# Patient Record
Sex: Female | Born: 1950
Health system: Southern US, Community
[De-identification: ages and names within clinical notes are randomized; demographics above are authoritative.]

## PROBLEM LIST (undated history)

## (undated) DIAGNOSIS — K589 Irritable bowel syndrome without diarrhea: Secondary | ICD-10-CM

## (undated) DIAGNOSIS — K219 Gastro-esophageal reflux disease without esophagitis: Secondary | ICD-10-CM

## (undated) DIAGNOSIS — F32A Depression, unspecified: Secondary | ICD-10-CM

## (undated) DIAGNOSIS — F419 Anxiety disorder, unspecified: Secondary | ICD-10-CM

## (undated) DIAGNOSIS — E785 Hyperlipidemia, unspecified: Secondary | ICD-10-CM

## (undated) DIAGNOSIS — M1990 Unspecified osteoarthritis, unspecified site: Secondary | ICD-10-CM

## (undated) DIAGNOSIS — I10 Essential (primary) hypertension: Secondary | ICD-10-CM

## (undated) DIAGNOSIS — K8020 Calculus of gallbladder without cholecystitis without obstruction: Secondary | ICD-10-CM

## (undated) DIAGNOSIS — E119 Type 2 diabetes mellitus without complications: Secondary | ICD-10-CM

## (undated) DIAGNOSIS — M797 Fibromyalgia: Secondary | ICD-10-CM

## (undated) HISTORY — DX: Fibromyalgia: M79.7

## (undated) HISTORY — PX: NASAL SEPTUM SURGERY: SHX37

## (undated) HISTORY — DX: Anxiety disorder, unspecified: F41.9

## (undated) HISTORY — DX: Hyperlipidemia, unspecified: E78.5

## (undated) HISTORY — DX: Unspecified osteoarthritis, unspecified site: M19.90

## (undated) HISTORY — DX: Essential (primary) hypertension: I10

## (undated) HISTORY — DX: Calculus of gallbladder without cholecystitis without obstruction: K80.20

## (undated) HISTORY — PX: ROTATOR CUFF REPAIR: SHX139

## (undated) HISTORY — PX: KNEE CARTILAGE SURGERY: SHX688

## (undated) HISTORY — PX: COLONOSCOPY: SHX174

## (undated) HISTORY — PX: CHOLECYSTECTOMY: SHX55

## (undated) HISTORY — DX: Irritable bowel syndrome, unspecified: K58.9

## (undated) HISTORY — DX: Gastro-esophageal reflux disease without esophagitis: K21.9

## (undated) HISTORY — DX: Depression, unspecified: F32.A

## (undated) HISTORY — DX: Type 2 diabetes mellitus without complications: E11.9

---

## 2015-02-26 DIAGNOSIS — Z23 Encounter for immunization: Secondary | ICD-10-CM | POA: Diagnosis not present

## 2015-03-24 DIAGNOSIS — M2140 Flat foot [pes planus] (acquired), unspecified foot: Secondary | ICD-10-CM | POA: Diagnosis not present

## 2015-04-14 DIAGNOSIS — R3915 Urgency of urination: Secondary | ICD-10-CM | POA: Diagnosis not present

## 2015-04-16 DIAGNOSIS — R3915 Urgency of urination: Secondary | ICD-10-CM | POA: Diagnosis not present

## 2015-05-04 DIAGNOSIS — M2140 Flat foot [pes planus] (acquired), unspecified foot: Secondary | ICD-10-CM | POA: Diagnosis not present

## 2015-05-04 DIAGNOSIS — M797 Fibromyalgia: Secondary | ICD-10-CM | POA: Diagnosis not present

## 2015-06-16 DIAGNOSIS — J309 Allergic rhinitis, unspecified: Secondary | ICD-10-CM | POA: Diagnosis not present

## 2015-06-16 DIAGNOSIS — M797 Fibromyalgia: Secondary | ICD-10-CM | POA: Diagnosis not present

## 2015-06-16 DIAGNOSIS — F5105 Insomnia due to other mental disorder: Secondary | ICD-10-CM | POA: Diagnosis not present

## 2015-06-16 DIAGNOSIS — I10 Essential (primary) hypertension: Secondary | ICD-10-CM | POA: Diagnosis not present

## 2015-06-16 DIAGNOSIS — Z0000 Encounter for general adult medical examination without abnormal findings: Secondary | ICD-10-CM | POA: Diagnosis not present

## 2015-06-16 DIAGNOSIS — E119 Type 2 diabetes mellitus without complications: Secondary | ICD-10-CM | POA: Diagnosis not present

## 2015-07-30 ENCOUNTER — Ambulatory Visit (INDEPENDENT_AMBULATORY_CARE_PROVIDER_SITE_OTHER): Payer: Medicare Other | Admitting: Otolaryngology

## 2015-07-30 DIAGNOSIS — R070 Pain in throat: Secondary | ICD-10-CM | POA: Diagnosis not present

## 2015-11-19 DIAGNOSIS — Z23 Encounter for immunization: Secondary | ICD-10-CM | POA: Diagnosis not present

## 2016-01-11 DIAGNOSIS — R0989 Other specified symptoms and signs involving the circulatory and respiratory systems: Secondary | ICD-10-CM | POA: Diagnosis not present

## 2016-01-11 DIAGNOSIS — I10 Essential (primary) hypertension: Secondary | ICD-10-CM | POA: Diagnosis not present

## 2016-01-11 DIAGNOSIS — E119 Type 2 diabetes mellitus without complications: Secondary | ICD-10-CM | POA: Diagnosis not present

## 2016-05-31 DIAGNOSIS — R3915 Urgency of urination: Secondary | ICD-10-CM | POA: Diagnosis not present

## 2016-07-18 DIAGNOSIS — E119 Type 2 diabetes mellitus without complications: Secondary | ICD-10-CM | POA: Diagnosis not present

## 2016-07-18 DIAGNOSIS — I10 Essential (primary) hypertension: Secondary | ICD-10-CM | POA: Diagnosis not present

## 2016-07-18 DIAGNOSIS — J309 Allergic rhinitis, unspecified: Secondary | ICD-10-CM | POA: Diagnosis not present

## 2016-07-18 DIAGNOSIS — M797 Fibromyalgia: Secondary | ICD-10-CM | POA: Diagnosis not present

## 2016-07-18 DIAGNOSIS — Z0000 Encounter for general adult medical examination without abnormal findings: Secondary | ICD-10-CM | POA: Diagnosis not present

## 2016-07-18 DIAGNOSIS — F5105 Insomnia due to other mental disorder: Secondary | ICD-10-CM | POA: Diagnosis not present

## 2016-07-18 DIAGNOSIS — Z6841 Body Mass Index (BMI) 40.0 and over, adult: Secondary | ICD-10-CM | POA: Diagnosis not present

## 2016-08-02 DIAGNOSIS — Z1231 Encounter for screening mammogram for malignant neoplasm of breast: Secondary | ICD-10-CM | POA: Diagnosis not present

## 2016-10-26 DIAGNOSIS — Z23 Encounter for immunization: Secondary | ICD-10-CM | POA: Diagnosis not present

## 2017-02-16 DIAGNOSIS — I10 Essential (primary) hypertension: Secondary | ICD-10-CM | POA: Diagnosis not present

## 2017-02-16 DIAGNOSIS — E119 Type 2 diabetes mellitus without complications: Secondary | ICD-10-CM | POA: Diagnosis not present

## 2017-02-16 DIAGNOSIS — F5101 Primary insomnia: Secondary | ICD-10-CM | POA: Diagnosis not present

## 2017-02-16 DIAGNOSIS — Z6836 Body mass index (BMI) 36.0-36.9, adult: Secondary | ICD-10-CM | POA: Diagnosis not present

## 2017-02-16 DIAGNOSIS — M797 Fibromyalgia: Secondary | ICD-10-CM | POA: Diagnosis not present

## 2017-03-27 DIAGNOSIS — E782 Mixed hyperlipidemia: Secondary | ICD-10-CM | POA: Diagnosis not present

## 2017-03-27 DIAGNOSIS — E119 Type 2 diabetes mellitus without complications: Secondary | ICD-10-CM | POA: Diagnosis not present

## 2017-03-27 DIAGNOSIS — I10 Essential (primary) hypertension: Secondary | ICD-10-CM | POA: Diagnosis not present

## 2017-03-30 DIAGNOSIS — E119 Type 2 diabetes mellitus without complications: Secondary | ICD-10-CM | POA: Diagnosis not present

## 2017-03-30 DIAGNOSIS — J029 Acute pharyngitis, unspecified: Secondary | ICD-10-CM | POA: Diagnosis not present

## 2017-03-30 DIAGNOSIS — Z6837 Body mass index (BMI) 37.0-37.9, adult: Secondary | ICD-10-CM | POA: Diagnosis not present

## 2017-10-23 DIAGNOSIS — Z1231 Encounter for screening mammogram for malignant neoplasm of breast: Secondary | ICD-10-CM | POA: Diagnosis not present

## 2017-10-24 DIAGNOSIS — Z23 Encounter for immunization: Secondary | ICD-10-CM | POA: Diagnosis not present

## 2017-12-13 DIAGNOSIS — Z6837 Body mass index (BMI) 37.0-37.9, adult: Secondary | ICD-10-CM | POA: Diagnosis not present

## 2017-12-13 DIAGNOSIS — J029 Acute pharyngitis, unspecified: Secondary | ICD-10-CM | POA: Diagnosis not present

## 2017-12-13 DIAGNOSIS — E119 Type 2 diabetes mellitus without complications: Secondary | ICD-10-CM | POA: Diagnosis not present

## 2017-12-18 DIAGNOSIS — K210 Gastro-esophageal reflux disease with esophagitis: Secondary | ICD-10-CM | POA: Diagnosis not present

## 2017-12-18 DIAGNOSIS — E1165 Type 2 diabetes mellitus with hyperglycemia: Secondary | ICD-10-CM | POA: Diagnosis not present

## 2017-12-18 DIAGNOSIS — E782 Mixed hyperlipidemia: Secondary | ICD-10-CM | POA: Diagnosis not present

## 2017-12-18 DIAGNOSIS — F331 Major depressive disorder, recurrent, moderate: Secondary | ICD-10-CM | POA: Diagnosis not present

## 2017-12-18 DIAGNOSIS — I10 Essential (primary) hypertension: Secondary | ICD-10-CM | POA: Diagnosis not present

## 2017-12-26 ENCOUNTER — Other Ambulatory Visit (HOSPITAL_COMMUNITY): Payer: Self-pay | Admitting: Internal Medicine

## 2017-12-26 DIAGNOSIS — Z780 Asymptomatic menopausal state: Secondary | ICD-10-CM

## 2018-01-05 DIAGNOSIS — Z1211 Encounter for screening for malignant neoplasm of colon: Secondary | ICD-10-CM | POA: Diagnosis not present

## 2018-01-05 DIAGNOSIS — Z1212 Encounter for screening for malignant neoplasm of rectum: Secondary | ICD-10-CM | POA: Diagnosis not present

## 2018-01-11 ENCOUNTER — Ambulatory Visit (HOSPITAL_COMMUNITY)
Admission: RE | Admit: 2018-01-11 | Discharge: 2018-01-11 | Disposition: A | Discharge status: Home / Self Care | Payer: Medicare Other | Source: Ambulatory Visit | Attending: Internal Medicine | Admitting: Internal Medicine

## 2018-01-11 DIAGNOSIS — Z780 Asymptomatic menopausal state: Secondary | ICD-10-CM | POA: Insufficient documentation

## 2018-01-11 DIAGNOSIS — M810 Age-related osteoporosis without current pathological fracture: Secondary | ICD-10-CM | POA: Diagnosis not present

## 2018-01-11 DIAGNOSIS — M85832 Other specified disorders of bone density and structure, left forearm: Secondary | ICD-10-CM | POA: Diagnosis not present

## 2018-03-20 DIAGNOSIS — F331 Major depressive disorder, recurrent, moderate: Secondary | ICD-10-CM | POA: Diagnosis not present

## 2018-03-20 DIAGNOSIS — E1165 Type 2 diabetes mellitus with hyperglycemia: Secondary | ICD-10-CM | POA: Diagnosis not present

## 2018-03-20 DIAGNOSIS — R079 Chest pain, unspecified: Secondary | ICD-10-CM | POA: Diagnosis not present

## 2018-04-30 DIAGNOSIS — F411 Generalized anxiety disorder: Secondary | ICD-10-CM | POA: Diagnosis not present

## 2018-06-18 DIAGNOSIS — F419 Anxiety disorder, unspecified: Secondary | ICD-10-CM | POA: Diagnosis not present

## 2018-06-18 DIAGNOSIS — F331 Major depressive disorder, recurrent, moderate: Secondary | ICD-10-CM | POA: Diagnosis not present

## 2018-09-19 DIAGNOSIS — M25562 Pain in left knee: Secondary | ICD-10-CM | POA: Diagnosis not present

## 2018-09-19 DIAGNOSIS — F331 Major depressive disorder, recurrent, moderate: Secondary | ICD-10-CM | POA: Diagnosis not present

## 2018-09-19 DIAGNOSIS — F419 Anxiety disorder, unspecified: Secondary | ICD-10-CM | POA: Diagnosis not present

## 2018-09-19 DIAGNOSIS — M25552 Pain in left hip: Secondary | ICD-10-CM | POA: Diagnosis not present

## 2018-09-27 DIAGNOSIS — M48061 Spinal stenosis, lumbar region without neurogenic claudication: Secondary | ICD-10-CM | POA: Insufficient documentation

## 2018-09-27 DIAGNOSIS — M7062 Trochanteric bursitis, left hip: Secondary | ICD-10-CM | POA: Diagnosis not present

## 2018-09-27 DIAGNOSIS — M7060 Trochanteric bursitis, unspecified hip: Secondary | ICD-10-CM | POA: Insufficient documentation

## 2018-09-27 DIAGNOSIS — M5410 Radiculopathy, site unspecified: Secondary | ICD-10-CM | POA: Insufficient documentation

## 2018-09-27 DIAGNOSIS — M7061 Trochanteric bursitis, right hip: Secondary | ICD-10-CM | POA: Diagnosis not present

## 2018-10-16 DIAGNOSIS — E11319 Type 2 diabetes mellitus with unspecified diabetic retinopathy without macular edema: Secondary | ICD-10-CM | POA: Diagnosis not present

## 2018-10-29 DIAGNOSIS — Z23 Encounter for immunization: Secondary | ICD-10-CM | POA: Diagnosis not present

## 2018-11-28 DIAGNOSIS — M7060 Trochanteric bursitis, unspecified hip: Secondary | ICD-10-CM | POA: Diagnosis not present

## 2018-12-04 DIAGNOSIS — R350 Frequency of micturition: Secondary | ICD-10-CM | POA: Diagnosis not present

## 2018-12-05 DIAGNOSIS — R3915 Urgency of urination: Secondary | ICD-10-CM | POA: Diagnosis not present

## 2018-12-05 DIAGNOSIS — R319 Hematuria, unspecified: Secondary | ICD-10-CM | POA: Diagnosis not present

## 2018-12-05 DIAGNOSIS — N390 Urinary tract infection, site not specified: Secondary | ICD-10-CM | POA: Diagnosis not present

## 2018-12-13 DIAGNOSIS — H2511 Age-related nuclear cataract, right eye: Secondary | ICD-10-CM | POA: Diagnosis not present

## 2018-12-13 DIAGNOSIS — H35033 Hypertensive retinopathy, bilateral: Secondary | ICD-10-CM | POA: Diagnosis not present

## 2018-12-13 DIAGNOSIS — H25013 Cortical age-related cataract, bilateral: Secondary | ICD-10-CM | POA: Diagnosis not present

## 2018-12-13 DIAGNOSIS — H2513 Age-related nuclear cataract, bilateral: Secondary | ICD-10-CM | POA: Diagnosis not present

## 2018-12-13 DIAGNOSIS — H35363 Drusen (degenerative) of macula, bilateral: Secondary | ICD-10-CM | POA: Diagnosis not present

## 2018-12-27 DIAGNOSIS — Z1329 Encounter for screening for other suspected endocrine disorder: Secondary | ICD-10-CM | POA: Diagnosis not present

## 2018-12-27 DIAGNOSIS — I10 Essential (primary) hypertension: Secondary | ICD-10-CM | POA: Diagnosis not present

## 2018-12-27 DIAGNOSIS — M797 Fibromyalgia: Secondary | ICD-10-CM | POA: Diagnosis not present

## 2018-12-27 DIAGNOSIS — E119 Type 2 diabetes mellitus without complications: Secondary | ICD-10-CM | POA: Diagnosis not present

## 2018-12-31 DIAGNOSIS — M25562 Pain in left knee: Secondary | ICD-10-CM | POA: Diagnosis not present

## 2018-12-31 DIAGNOSIS — F419 Anxiety disorder, unspecified: Secondary | ICD-10-CM | POA: Diagnosis not present

## 2018-12-31 DIAGNOSIS — E1165 Type 2 diabetes mellitus with hyperglycemia: Secondary | ICD-10-CM | POA: Diagnosis not present

## 2018-12-31 DIAGNOSIS — K219 Gastro-esophageal reflux disease without esophagitis: Secondary | ICD-10-CM | POA: Diagnosis not present

## 2018-12-31 DIAGNOSIS — I10 Essential (primary) hypertension: Secondary | ICD-10-CM | POA: Diagnosis not present

## 2018-12-31 DIAGNOSIS — M545 Low back pain: Secondary | ICD-10-CM | POA: Diagnosis not present

## 2018-12-31 DIAGNOSIS — F331 Major depressive disorder, recurrent, moderate: Secondary | ICD-10-CM | POA: Diagnosis not present

## 2018-12-31 DIAGNOSIS — E782 Mixed hyperlipidemia: Secondary | ICD-10-CM | POA: Diagnosis not present

## 2019-01-08 DIAGNOSIS — H25811 Combined forms of age-related cataract, right eye: Secondary | ICD-10-CM | POA: Diagnosis not present

## 2019-02-05 DIAGNOSIS — H2513 Age-related nuclear cataract, bilateral: Secondary | ICD-10-CM | POA: Diagnosis not present

## 2019-02-05 DIAGNOSIS — H25812 Combined forms of age-related cataract, left eye: Secondary | ICD-10-CM | POA: Diagnosis not present

## 2019-02-15 DIAGNOSIS — H2513 Age-related nuclear cataract, bilateral: Secondary | ICD-10-CM | POA: Diagnosis not present

## 2019-05-28 DIAGNOSIS — M7062 Trochanteric bursitis, left hip: Secondary | ICD-10-CM | POA: Diagnosis not present

## 2019-05-28 DIAGNOSIS — M7061 Trochanteric bursitis, right hip: Secondary | ICD-10-CM | POA: Diagnosis not present

## 2019-05-29 DIAGNOSIS — E119 Type 2 diabetes mellitus without complications: Secondary | ICD-10-CM | POA: Diagnosis not present

## 2019-05-29 DIAGNOSIS — I10 Essential (primary) hypertension: Secondary | ICD-10-CM | POA: Diagnosis not present

## 2019-05-29 DIAGNOSIS — F418 Other specified anxiety disorders: Secondary | ICD-10-CM | POA: Diagnosis not present

## 2019-05-29 DIAGNOSIS — F419 Anxiety disorder, unspecified: Secondary | ICD-10-CM | POA: Diagnosis not present

## 2019-05-29 DIAGNOSIS — M797 Fibromyalgia: Secondary | ICD-10-CM | POA: Diagnosis not present

## 2019-05-29 DIAGNOSIS — Z79899 Other long term (current) drug therapy: Secondary | ICD-10-CM | POA: Diagnosis not present

## 2019-06-04 DIAGNOSIS — R110 Nausea: Secondary | ICD-10-CM | POA: Diagnosis not present

## 2019-06-24 DIAGNOSIS — M797 Fibromyalgia: Secondary | ICD-10-CM | POA: Diagnosis not present

## 2019-06-24 DIAGNOSIS — I10 Essential (primary) hypertension: Secondary | ICD-10-CM | POA: Diagnosis not present

## 2019-06-24 DIAGNOSIS — F411 Generalized anxiety disorder: Secondary | ICD-10-CM | POA: Diagnosis not present

## 2019-06-24 DIAGNOSIS — F419 Anxiety disorder, unspecified: Secondary | ICD-10-CM | POA: Diagnosis not present

## 2019-06-24 DIAGNOSIS — E119 Type 2 diabetes mellitus without complications: Secondary | ICD-10-CM | POA: Diagnosis not present

## 2019-06-24 DIAGNOSIS — F5101 Primary insomnia: Secondary | ICD-10-CM | POA: Diagnosis not present

## 2019-06-24 DIAGNOSIS — R350 Frequency of micturition: Secondary | ICD-10-CM | POA: Diagnosis not present

## 2019-06-24 DIAGNOSIS — F331 Major depressive disorder, recurrent, moderate: Secondary | ICD-10-CM | POA: Diagnosis not present

## 2019-06-24 DIAGNOSIS — E039 Hypothyroidism, unspecified: Secondary | ICD-10-CM | POA: Diagnosis not present

## 2019-06-24 DIAGNOSIS — Z6836 Body mass index (BMI) 36.0-36.9, adult: Secondary | ICD-10-CM | POA: Diagnosis not present

## 2019-07-01 DIAGNOSIS — M25562 Pain in left knee: Secondary | ICD-10-CM | POA: Diagnosis not present

## 2019-07-01 DIAGNOSIS — E1165 Type 2 diabetes mellitus with hyperglycemia: Secondary | ICD-10-CM | POA: Diagnosis not present

## 2019-07-01 DIAGNOSIS — I10 Essential (primary) hypertension: Secondary | ICD-10-CM | POA: Diagnosis not present

## 2019-07-01 DIAGNOSIS — F419 Anxiety disorder, unspecified: Secondary | ICD-10-CM | POA: Diagnosis not present

## 2019-07-01 DIAGNOSIS — M545 Low back pain: Secondary | ICD-10-CM | POA: Diagnosis not present

## 2019-07-01 DIAGNOSIS — D751 Secondary polycythemia: Secondary | ICD-10-CM | POA: Diagnosis not present

## 2019-07-01 DIAGNOSIS — F331 Major depressive disorder, recurrent, moderate: Secondary | ICD-10-CM | POA: Diagnosis not present

## 2019-07-01 DIAGNOSIS — K219 Gastro-esophageal reflux disease without esophagitis: Secondary | ICD-10-CM | POA: Diagnosis not present

## 2019-07-01 DIAGNOSIS — E782 Mixed hyperlipidemia: Secondary | ICD-10-CM | POA: Diagnosis not present

## 2019-07-02 DIAGNOSIS — Z299 Encounter for prophylactic measures, unspecified: Secondary | ICD-10-CM | POA: Diagnosis not present

## 2019-07-02 DIAGNOSIS — Z789 Other specified health status: Secondary | ICD-10-CM | POA: Diagnosis not present

## 2019-07-02 DIAGNOSIS — F419 Anxiety disorder, unspecified: Secondary | ICD-10-CM | POA: Diagnosis not present

## 2019-07-02 DIAGNOSIS — E878 Other disorders of electrolyte and fluid balance, not elsewhere classified: Secondary | ICD-10-CM | POA: Diagnosis not present

## 2019-07-02 DIAGNOSIS — E119 Type 2 diabetes mellitus without complications: Secondary | ICD-10-CM | POA: Diagnosis not present

## 2019-07-02 DIAGNOSIS — I10 Essential (primary) hypertension: Secondary | ICD-10-CM | POA: Diagnosis not present

## 2019-08-26 DIAGNOSIS — H35033 Hypertensive retinopathy, bilateral: Secondary | ICD-10-CM | POA: Diagnosis not present

## 2019-10-02 DIAGNOSIS — M7060 Trochanteric bursitis, unspecified hip: Secondary | ICD-10-CM | POA: Diagnosis not present

## 2019-10-02 DIAGNOSIS — R29898 Other symptoms and signs involving the musculoskeletal system: Secondary | ICD-10-CM | POA: Insufficient documentation

## 2019-10-10 DIAGNOSIS — Z23 Encounter for immunization: Secondary | ICD-10-CM | POA: Diagnosis not present

## 2019-10-30 DIAGNOSIS — N6489 Other specified disorders of breast: Secondary | ICD-10-CM | POA: Diagnosis not present

## 2019-10-30 DIAGNOSIS — N6312 Unspecified lump in the right breast, upper inner quadrant: Secondary | ICD-10-CM | POA: Diagnosis not present

## 2019-10-30 DIAGNOSIS — R928 Other abnormal and inconclusive findings on diagnostic imaging of breast: Secondary | ICD-10-CM | POA: Diagnosis not present

## 2019-10-30 DIAGNOSIS — N644 Mastodynia: Secondary | ICD-10-CM | POA: Diagnosis not present

## 2019-10-31 DIAGNOSIS — Z23 Encounter for immunization: Secondary | ICD-10-CM | POA: Diagnosis not present

## 2019-11-13 DIAGNOSIS — Z23 Encounter for immunization: Secondary | ICD-10-CM | POA: Diagnosis not present

## 2019-12-23 DIAGNOSIS — F419 Anxiety disorder, unspecified: Secondary | ICD-10-CM | POA: Diagnosis not present

## 2019-12-23 DIAGNOSIS — M7060 Trochanteric bursitis, unspecified hip: Secondary | ICD-10-CM | POA: Diagnosis not present

## 2019-12-23 DIAGNOSIS — I10 Essential (primary) hypertension: Secondary | ICD-10-CM | POA: Diagnosis not present

## 2019-12-23 DIAGNOSIS — Z299 Encounter for prophylactic measures, unspecified: Secondary | ICD-10-CM | POA: Diagnosis not present

## 2019-12-23 DIAGNOSIS — E119 Type 2 diabetes mellitus without complications: Secondary | ICD-10-CM | POA: Diagnosis not present

## 2019-12-23 DIAGNOSIS — Z6833 Body mass index (BMI) 33.0-33.9, adult: Secondary | ICD-10-CM | POA: Diagnosis not present

## 2020-01-06 DIAGNOSIS — M7062 Trochanteric bursitis, left hip: Secondary | ICD-10-CM | POA: Diagnosis not present

## 2020-01-06 DIAGNOSIS — M7061 Trochanteric bursitis, right hip: Secondary | ICD-10-CM | POA: Diagnosis not present

## 2020-01-06 DIAGNOSIS — M545 Low back pain, unspecified: Secondary | ICD-10-CM | POA: Diagnosis not present

## 2020-01-06 DIAGNOSIS — M419 Scoliosis, unspecified: Secondary | ICD-10-CM | POA: Diagnosis not present

## 2020-02-11 DIAGNOSIS — I10 Essential (primary) hypertension: Secondary | ICD-10-CM | POA: Diagnosis not present

## 2020-02-11 DIAGNOSIS — Z6832 Body mass index (BMI) 32.0-32.9, adult: Secondary | ICD-10-CM | POA: Diagnosis not present

## 2020-02-11 DIAGNOSIS — Z789 Other specified health status: Secondary | ICD-10-CM | POA: Diagnosis not present

## 2020-02-11 DIAGNOSIS — E1165 Type 2 diabetes mellitus with hyperglycemia: Secondary | ICD-10-CM | POA: Diagnosis not present

## 2020-02-11 DIAGNOSIS — M7060 Trochanteric bursitis, unspecified hip: Secondary | ICD-10-CM | POA: Diagnosis not present

## 2020-02-11 DIAGNOSIS — Z299 Encounter for prophylactic measures, unspecified: Secondary | ICD-10-CM | POA: Diagnosis not present

## 2020-02-18 DIAGNOSIS — R110 Nausea: Secondary | ICD-10-CM | POA: Diagnosis not present

## 2020-02-18 DIAGNOSIS — M7060 Trochanteric bursitis, unspecified hip: Secondary | ICD-10-CM | POA: Diagnosis not present

## 2020-02-18 DIAGNOSIS — I10 Essential (primary) hypertension: Secondary | ICD-10-CM | POA: Diagnosis not present

## 2020-02-18 DIAGNOSIS — Z299 Encounter for prophylactic measures, unspecified: Secondary | ICD-10-CM | POA: Diagnosis not present

## 2020-02-18 DIAGNOSIS — E119 Type 2 diabetes mellitus without complications: Secondary | ICD-10-CM | POA: Diagnosis not present

## 2020-03-04 DIAGNOSIS — L659 Nonscarring hair loss, unspecified: Secondary | ICD-10-CM | POA: Diagnosis not present

## 2020-03-09 DIAGNOSIS — I10 Essential (primary) hypertension: Secondary | ICD-10-CM | POA: Diagnosis not present

## 2020-04-06 DIAGNOSIS — I10 Essential (primary) hypertension: Secondary | ICD-10-CM | POA: Diagnosis not present

## 2020-05-07 DIAGNOSIS — I10 Essential (primary) hypertension: Secondary | ICD-10-CM | POA: Diagnosis not present

## 2020-06-05 DIAGNOSIS — I10 Essential (primary) hypertension: Secondary | ICD-10-CM | POA: Diagnosis not present

## 2020-07-07 DIAGNOSIS — I10 Essential (primary) hypertension: Secondary | ICD-10-CM | POA: Diagnosis not present

## 2020-08-03 DIAGNOSIS — R29898 Other symptoms and signs involving the musculoskeletal system: Secondary | ICD-10-CM | POA: Diagnosis not present

## 2020-08-03 DIAGNOSIS — M7061 Trochanteric bursitis, right hip: Secondary | ICD-10-CM | POA: Diagnosis not present

## 2020-08-03 DIAGNOSIS — M48061 Spinal stenosis, lumbar region without neurogenic claudication: Secondary | ICD-10-CM | POA: Diagnosis not present

## 2020-08-03 DIAGNOSIS — M7062 Trochanteric bursitis, left hip: Secondary | ICD-10-CM | POA: Diagnosis not present

## 2020-08-06 DIAGNOSIS — I10 Essential (primary) hypertension: Secondary | ICD-10-CM | POA: Diagnosis not present

## 2020-09-04 DIAGNOSIS — I10 Essential (primary) hypertension: Secondary | ICD-10-CM | POA: Diagnosis not present

## 2020-09-07 DIAGNOSIS — M48061 Spinal stenosis, lumbar region without neurogenic claudication: Secondary | ICD-10-CM | POA: Diagnosis not present

## 2020-09-07 DIAGNOSIS — M7061 Trochanteric bursitis, right hip: Secondary | ICD-10-CM | POA: Diagnosis not present

## 2020-09-07 DIAGNOSIS — M7062 Trochanteric bursitis, left hip: Secondary | ICD-10-CM | POA: Diagnosis not present

## 2020-09-07 DIAGNOSIS — R29898 Other symptoms and signs involving the musculoskeletal system: Secondary | ICD-10-CM | POA: Diagnosis not present

## 2020-09-28 DIAGNOSIS — M4186 Other forms of scoliosis, lumbar region: Secondary | ICD-10-CM | POA: Diagnosis not present

## 2020-09-28 DIAGNOSIS — Z9049 Acquired absence of other specified parts of digestive tract: Secondary | ICD-10-CM | POA: Diagnosis not present

## 2020-09-28 DIAGNOSIS — M47816 Spondylosis without myelopathy or radiculopathy, lumbar region: Secondary | ICD-10-CM | POA: Diagnosis not present

## 2020-09-28 DIAGNOSIS — R634 Abnormal weight loss: Secondary | ICD-10-CM | POA: Diagnosis not present

## 2020-09-30 DIAGNOSIS — R110 Nausea: Secondary | ICD-10-CM | POA: Diagnosis not present

## 2020-09-30 DIAGNOSIS — Z299 Encounter for prophylactic measures, unspecified: Secondary | ICD-10-CM | POA: Diagnosis not present

## 2020-09-30 DIAGNOSIS — K589 Irritable bowel syndrome without diarrhea: Secondary | ICD-10-CM | POA: Diagnosis not present

## 2020-09-30 DIAGNOSIS — L237 Allergic contact dermatitis due to plants, except food: Secondary | ICD-10-CM | POA: Diagnosis not present

## 2020-09-30 DIAGNOSIS — I10 Essential (primary) hypertension: Secondary | ICD-10-CM | POA: Diagnosis not present

## 2020-10-07 DIAGNOSIS — I10 Essential (primary) hypertension: Secondary | ICD-10-CM | POA: Diagnosis not present

## 2020-10-28 DIAGNOSIS — Z23 Encounter for immunization: Secondary | ICD-10-CM | POA: Diagnosis not present

## 2020-10-28 DIAGNOSIS — Z20828 Contact with and (suspected) exposure to other viral communicable diseases: Secondary | ICD-10-CM | POA: Diagnosis not present

## 2020-10-28 DIAGNOSIS — M7062 Trochanteric bursitis, left hip: Secondary | ICD-10-CM | POA: Diagnosis not present

## 2020-10-28 DIAGNOSIS — M7061 Trochanteric bursitis, right hip: Secondary | ICD-10-CM | POA: Diagnosis not present

## 2020-11-02 DIAGNOSIS — F419 Anxiety disorder, unspecified: Secondary | ICD-10-CM | POA: Diagnosis not present

## 2020-11-02 DIAGNOSIS — Z299 Encounter for prophylactic measures, unspecified: Secondary | ICD-10-CM | POA: Diagnosis not present

## 2020-11-02 DIAGNOSIS — K589 Irritable bowel syndrome without diarrhea: Secondary | ICD-10-CM | POA: Diagnosis not present

## 2020-11-02 DIAGNOSIS — Z6829 Body mass index (BMI) 29.0-29.9, adult: Secondary | ICD-10-CM | POA: Diagnosis not present

## 2020-11-02 DIAGNOSIS — Z713 Dietary counseling and surveillance: Secondary | ICD-10-CM | POA: Diagnosis not present

## 2020-11-02 DIAGNOSIS — I10 Essential (primary) hypertension: Secondary | ICD-10-CM | POA: Diagnosis not present

## 2020-11-04 ENCOUNTER — Encounter: Payer: Self-pay | Admitting: Internal Medicine

## 2020-11-06 DIAGNOSIS — I10 Essential (primary) hypertension: Secondary | ICD-10-CM | POA: Diagnosis not present

## 2020-12-03 DIAGNOSIS — K589 Irritable bowel syndrome without diarrhea: Secondary | ICD-10-CM | POA: Diagnosis not present

## 2020-12-03 DIAGNOSIS — Z299 Encounter for prophylactic measures, unspecified: Secondary | ICD-10-CM | POA: Diagnosis not present

## 2020-12-03 DIAGNOSIS — I10 Essential (primary) hypertension: Secondary | ICD-10-CM | POA: Diagnosis not present

## 2020-12-03 DIAGNOSIS — E1165 Type 2 diabetes mellitus with hyperglycemia: Secondary | ICD-10-CM | POA: Diagnosis not present

## 2020-12-03 DIAGNOSIS — Z6828 Body mass index (BMI) 28.0-28.9, adult: Secondary | ICD-10-CM | POA: Diagnosis not present

## 2020-12-07 DIAGNOSIS — I10 Essential (primary) hypertension: Secondary | ICD-10-CM | POA: Diagnosis not present

## 2020-12-11 DIAGNOSIS — E1165 Type 2 diabetes mellitus with hyperglycemia: Secondary | ICD-10-CM | POA: Diagnosis not present

## 2020-12-11 DIAGNOSIS — I10 Essential (primary) hypertension: Secondary | ICD-10-CM | POA: Diagnosis not present

## 2020-12-11 DIAGNOSIS — Z299 Encounter for prophylactic measures, unspecified: Secondary | ICD-10-CM | POA: Diagnosis not present

## 2020-12-11 DIAGNOSIS — L659 Nonscarring hair loss, unspecified: Secondary | ICD-10-CM | POA: Diagnosis not present

## 2020-12-11 DIAGNOSIS — R350 Frequency of micturition: Secondary | ICD-10-CM | POA: Diagnosis not present

## 2020-12-14 DIAGNOSIS — M7061 Trochanteric bursitis, right hip: Secondary | ICD-10-CM | POA: Diagnosis not present

## 2020-12-14 DIAGNOSIS — M7062 Trochanteric bursitis, left hip: Secondary | ICD-10-CM | POA: Diagnosis not present

## 2020-12-25 DIAGNOSIS — Z79899 Other long term (current) drug therapy: Secondary | ICD-10-CM | POA: Diagnosis not present

## 2020-12-25 DIAGNOSIS — Z1339 Encounter for screening examination for other mental health and behavioral disorders: Secondary | ICD-10-CM | POA: Diagnosis not present

## 2020-12-25 DIAGNOSIS — Z7189 Other specified counseling: Secondary | ICD-10-CM | POA: Diagnosis not present

## 2020-12-25 DIAGNOSIS — Z789 Other specified health status: Secondary | ICD-10-CM | POA: Diagnosis not present

## 2020-12-25 DIAGNOSIS — Z6826 Body mass index (BMI) 26.0-26.9, adult: Secondary | ICD-10-CM | POA: Diagnosis not present

## 2020-12-25 DIAGNOSIS — F419 Anxiety disorder, unspecified: Secondary | ICD-10-CM | POA: Diagnosis not present

## 2020-12-25 DIAGNOSIS — E1165 Type 2 diabetes mellitus with hyperglycemia: Secondary | ICD-10-CM | POA: Diagnosis not present

## 2020-12-25 DIAGNOSIS — Z0000 Encounter for general adult medical examination without abnormal findings: Secondary | ICD-10-CM | POA: Diagnosis not present

## 2020-12-25 DIAGNOSIS — I10 Essential (primary) hypertension: Secondary | ICD-10-CM | POA: Diagnosis not present

## 2020-12-25 DIAGNOSIS — R109 Unspecified abdominal pain: Secondary | ICD-10-CM | POA: Diagnosis not present

## 2020-12-25 DIAGNOSIS — G4700 Insomnia, unspecified: Secondary | ICD-10-CM | POA: Diagnosis not present

## 2020-12-25 DIAGNOSIS — Z1331 Encounter for screening for depression: Secondary | ICD-10-CM | POA: Diagnosis not present

## 2020-12-25 DIAGNOSIS — Z299 Encounter for prophylactic measures, unspecified: Secondary | ICD-10-CM | POA: Diagnosis not present

## 2020-12-29 ENCOUNTER — Other Ambulatory Visit: Payer: Self-pay

## 2020-12-29 ENCOUNTER — Emergency Department (HOSPITAL_COMMUNITY)
Admission: EM | Admit: 2020-12-29 | Discharge: 2020-12-29 | Disposition: A | Discharge status: Home / Self Care | Payer: Medicare Other | Attending: Emergency Medicine | Admitting: Emergency Medicine

## 2020-12-29 DIAGNOSIS — R110 Nausea: Secondary | ICD-10-CM | POA: Diagnosis not present

## 2020-12-29 DIAGNOSIS — R630 Anorexia: Secondary | ICD-10-CM | POA: Insufficient documentation

## 2020-12-29 LAB — COMPREHENSIVE METABOLIC PANEL
ALT: 19 U/L (ref 0–44)
AST: 16 U/L (ref 15–41)
Albumin: 3.6 g/dL (ref 3.5–5.0)
Alkaline Phosphatase: 46 U/L (ref 38–126)
Anion gap: 10 (ref 5–15)
BUN: 15 mg/dL (ref 8–23)
CO2: 25 mmol/L (ref 22–32)
Calcium: 9 mg/dL (ref 8.9–10.3)
Chloride: 102 mmol/L (ref 98–111)
Creatinine, Ser: 1.02 mg/dL — ABNORMAL HIGH (ref 0.44–1.00)
GFR, Estimated: 59 mL/min — ABNORMAL LOW (ref >60)
Glucose, Bld: 151 mg/dL — ABNORMAL HIGH (ref 70–99)
Potassium: 3.2 mmol/L — ABNORMAL LOW (ref 3.5–5.1)
Sodium: 137 mmol/L (ref 135–145)
Total Bilirubin: 0.7 mg/dL (ref 0.3–1.2)
Total Protein: 6.1 g/dL — ABNORMAL LOW (ref 6.5–8.1)

## 2020-12-29 LAB — CBC WITH DIFFERENTIAL/PLATELET
Abs Immature Granulocytes: 0.04 10*3/uL (ref 0.00–0.07)
Basophils Absolute: 0.1 10*3/uL (ref 0.0–0.1)
Basophils Relative: 1 %
Eosinophils Absolute: 0.1 10*3/uL (ref 0.0–0.5)
Eosinophils Relative: 0 %
HCT: 47.9 % — ABNORMAL HIGH (ref 36.0–46.0)
Hemoglobin: 15.8 g/dL — ABNORMAL HIGH (ref 12.0–15.0)
Immature Granulocytes: 0 %
Lymphocytes Relative: 14 %
Lymphs Abs: 1.6 10*3/uL (ref 0.7–4.0)
MCH: 29.9 pg (ref 26.0–34.0)
MCHC: 33 g/dL (ref 30.0–36.0)
MCV: 90.5 fL (ref 80.0–100.0)
Monocytes Absolute: 0.5 10*3/uL (ref 0.1–1.0)
Monocytes Relative: 5 %
Neutro Abs: 9.5 10*3/uL — ABNORMAL HIGH (ref 1.7–7.7)
Neutrophils Relative %: 80 %
Platelets: 304 10*3/uL (ref 150–400)
RBC: 5.29 MIL/uL — ABNORMAL HIGH (ref 3.87–5.11)
RDW: 12 % (ref 11.5–15.5)
WBC: 11.8 10*3/uL — ABNORMAL HIGH (ref 4.0–10.5)
nRBC: 0 % (ref 0.0–0.2)

## 2020-12-29 LAB — URINALYSIS, ROUTINE W REFLEX MICROSCOPIC
Bilirubin Urine: NEGATIVE
Glucose, UA: NEGATIVE mg/dL
Hgb urine dipstick: NEGATIVE
Ketones, ur: NEGATIVE mg/dL
Nitrite: NEGATIVE
Protein, ur: NEGATIVE mg/dL
Specific Gravity, Urine: 1.014 (ref 1.005–1.030)
pH: 5 (ref 5.0–8.0)

## 2020-12-29 LAB — LIPASE, BLOOD: Lipase: 68 U/L — ABNORMAL HIGH (ref 11–51)

## 2020-12-29 NOTE — ED Provider Notes (Signed)
Emergency Medicine Provider Triage Evaluation Note  Tina Reid , a 70 y.o. female  was evaluated in triage.  Pt complains of nausea.  She states that she has had nausea for "weeks."  She states that she has been dealing with generalized abdominal issues for months however the nausea started weeks ago.  She states that she has seen her primary care provider and GI and been put on multiple different medications for nausea but nothing has worked.  She denies vomiting.  She did have an episode of diarrhea however she was placed on medication and now feels constipated.  She denies fevers, dysuria, hematuria, vaginal discharge or bleeding.  She endorses 30 pound weight loss over the past 4 months.    Review of Systems  Positive: See above Negative:   Physical Exam  BP (!) 130/102 (BP Location: Left Arm)   Pulse (!) 103   Temp 98.8 F (37.1 C) (Oral)   Resp 18   SpO2 94%  Gen:   Awake, no distress   Resp:  Normal effort  MSK:   Moves extremities without difficulty  Other:  Abdomen is soft and nontender, positive bowel sounds.  Medical Decision Making  Medically screening exam initiated at 3:34 PM.  Appropriate orders placed.  Tina Reid was informed that the remainder of the evaluation will be completed by another provider, this initial triage assessment does not replace that evaluation, and the importance of remaining in the ED until their evaluation is complete.     Mickie Hillier, PA-C 12/29/20 1536    Godfrey Pick, MD 12/29/20 1949

## 2020-12-29 NOTE — ED Triage Notes (Signed)
Pt c/o on-going nausea, states she was having diarrhea and is now constipated. Reports decreased appetite. Has seen her PCP and is supposed to follow-up with GI but was unable to get an appointment until Feb.

## 2020-12-29 NOTE — Discharge Instructions (Signed)
Please follow-up closely with your primary care doctor for further managements of your condition.  Follow-up with GI specialist for further care.  Discussed with your doctor if your symptoms may be due to medication side effect.

## 2020-12-29 NOTE — ED Provider Notes (Signed)
[facility] EMERGENCY DEPARTMENT Provider Note   CSN: [id number] Arrival date & time: 12/29/20  1515     History Chief Complaint  Patient presents with   Nausea    Tina Reid is a 70 y.o. female.  The history is provided by the patient and medical records. No language interpreter was used.    70 year old female who presents with concerns of persistent nausea.  Patient states for the past 4 to 6 months she has had persistent nausea, decrease in appetite, having approximately 30 pounds weight loss within the past 4 months due to not being able to eat like she normally would.  Occasionally dorsum gnawing discomfort across her abdomen.  She also report having persistent diarrhea for period of time.  She was seen evaluated by her primary care provider and was giving several of her medication.  She was prescribed Bentyl, ondansetron, and several other medication to help with the symptoms.  She does have an appointment with a GI specialist but not until February of next year.  She is frustrated because despite the medication, symptoms still persist.  States that diarrhea did improve with the medication but now she feels more constipated.  She denies any recent illness she denies having fever chills chest pain shortness of breath dysuria back pain hematuria or rectal bleeding.  She was last seen by her PCP a week ago for complaint, was given Zofran which did not resolve her symptoms.  She has been taking metformin for many years and unsure if the symptoms that she is experiencing is due to a side effect.  She denies fever or night sweats.  No history of active cancer.  She has tried calling to have her GI appointment move to a closer date without any luck.      No past medical history on file.  There are no problems to display for this patient.   The histories are not reviewed yet. Please review them in the "History" navigator section and refresh this [place].   OB  History   No obstetric history on file.     No family history on file.     Home Medications Prior to Admission medications   Not on File    Allergies    Codeine  Review of Systems   Review of Systems  All other systems reviewed and are negative.  Physical Exam Updated Vital Signs BP (!) 121/91   Pulse 85   Temp 98.8 F (37.1 C) (Oral)   Resp 16   SpO2 95%   Physical Exam Vitals and nursing note reviewed.  Constitutional:      General: She is not in acute distress.    Appearance: She is well-developed.  HENT:     Head: Atraumatic.  Eyes:     Conjunctiva/sclera: Conjunctivae normal.  Cardiovascular:     Rate and Rhythm: Normal rate and regular rhythm.     Pulses: Normal pulses.     Heart sounds: Normal heart sounds.  Pulmonary:     Effort: Pulmonary effort is normal.  Abdominal:     General: Bowel sounds are normal.     Palpations: Abdomen is soft.     Tenderness: There is no abdominal tenderness.  Musculoskeletal:     Cervical back: Neck supple.  Skin:    Findings: No rash.  Neurological:     Mental Status: She is alert and oriented to person, place, and time.  Psychiatric:  Mood and Affect: Mood normal.    ED Results / Procedures / Treatments   Labs (all labs ordered are listed, but only abnormal results are displayed) Labs Reviewed  COMPREHENSIVE METABOLIC PANEL - Abnormal; Notable for the following components:      Result Value   Potassium 3.2 (*)    Glucose, Bld 151 (*)    Creatinine, Ser 1.02 (*)    Total Protein 6.1 (*)    GFR, Estimated 59 (*)    All other components within normal limits  LIPASE, BLOOD - Abnormal; Notable for the following components:   Lipase 68 (*)    All other components within normal limits  CBC WITH DIFFERENTIAL/PLATELET - Abnormal; Notable for the following components:   WBC 11.8 (*)    RBC 5.29 (*)    Hemoglobin 15.8 (*)    HCT 47.9 (*)    Neutro Abs 9.5 (*)    All other components within normal limits   URINALYSIS, ROUTINE W REFLEX MICROSCOPIC    EKG None  Radiology No results found.  Procedures Procedures   Medications Ordered in ED Medications - No data to display  ED Course  I have reviewed the triage vital signs and the nursing notes.  Pertinent labs & imaging results that were available during my care of the patient were reviewed by me and considered in my medical decision making (see chart for details).    MDM Rules/Calculators/A&P                           BP (!) 121/91   Pulse 83   Temp 98.8 F (37.1 C) (Oral)   Resp 19   SpO2 93%   Final Clinical Impression(s) / ED Diagnoses Final diagnoses:  Nausea    Rx / DC Orders ED Discharge Orders     None      6:14 PM Patient report decreased appetite, persistent nausea, losing weight due to lack of appetite and occasional experiencing abdominal discomfort.  She is frustrated because she has been seen and evaluated by her PCP and have received multiple medication for symptoms without resolution of her complaint.  She is also frustrated that she is unable to follow-up with a GI specialist in the time manner.  Patient however overall well-appearing, abdomen soft nontender, labs remarkable for minimally elevated lipase of 68.  Due to the chronicity of symptoms, I have low suspicion for acute emergent abdominal pathology.  Would just discussed option of obtaining an abdominal pelvis CT scan and through shared decision making we both felt CT results is likely noncontributory.  Strong encourage patient to follow-up closely with PCP for reassessment and further management.  Her symptoms could be due to medication side effect.  As she would benefit from GI follow-up.  Care discussed with Dr. Osborn Coho, Gertie Fey, PA-C 12/29/20 1846    Truddie Hidden, MD 12/29/20 2227

## 2020-12-29 NOTE — ED Notes (Signed)
Pt verbalized understanding of d/c instructions, meds, and followup care. Denies questions. VSS, no distress noted. Steady gait to exit with all belongings.  ?

## 2021-01-04 DIAGNOSIS — I10 Essential (primary) hypertension: Secondary | ICD-10-CM | POA: Diagnosis not present

## 2021-01-04 DIAGNOSIS — R634 Abnormal weight loss: Secondary | ICD-10-CM | POA: Diagnosis not present

## 2021-01-04 DIAGNOSIS — E1165 Type 2 diabetes mellitus with hyperglycemia: Secondary | ICD-10-CM | POA: Diagnosis not present

## 2021-01-04 DIAGNOSIS — K589 Irritable bowel syndrome without diarrhea: Secondary | ICD-10-CM | POA: Diagnosis not present

## 2021-01-04 DIAGNOSIS — Z299 Encounter for prophylactic measures, unspecified: Secondary | ICD-10-CM | POA: Diagnosis not present

## 2021-01-06 DIAGNOSIS — Z20828 Contact with and (suspected) exposure to other viral communicable diseases: Secondary | ICD-10-CM | POA: Diagnosis not present

## 2021-01-21 ENCOUNTER — Encounter: Payer: Self-pay | Admitting: Gastroenterology

## 2021-02-03 DIAGNOSIS — R110 Nausea: Secondary | ICD-10-CM | POA: Diagnosis not present

## 2021-02-03 DIAGNOSIS — F419 Anxiety disorder, unspecified: Secondary | ICD-10-CM | POA: Diagnosis not present

## 2021-02-03 DIAGNOSIS — Z299 Encounter for prophylactic measures, unspecified: Secondary | ICD-10-CM | POA: Diagnosis not present

## 2021-02-03 DIAGNOSIS — I10 Essential (primary) hypertension: Secondary | ICD-10-CM | POA: Diagnosis not present

## 2021-02-23 DIAGNOSIS — Z1231 Encounter for screening mammogram for malignant neoplasm of breast: Secondary | ICD-10-CM | POA: Diagnosis not present

## 2021-03-03 ENCOUNTER — Encounter: Payer: Self-pay | Admitting: Nurse Practitioner

## 2021-03-19 ENCOUNTER — Other Ambulatory Visit: Payer: Self-pay

## 2021-03-22 ENCOUNTER — Other Ambulatory Visit (INDEPENDENT_AMBULATORY_CARE_PROVIDER_SITE_OTHER): Payer: Medicare Other

## 2021-03-22 ENCOUNTER — Ambulatory Visit (INDEPENDENT_AMBULATORY_CARE_PROVIDER_SITE_OTHER): Payer: Medicare Other | Admitting: Nurse Practitioner

## 2021-03-22 ENCOUNTER — Encounter: Payer: Self-pay | Admitting: Nurse Practitioner

## 2021-03-22 VITALS — BP 110/84 | HR 96 | Ht 58.25 in | Wt 150.4 lb

## 2021-03-22 DIAGNOSIS — K529 Noninfective gastroenteritis and colitis, unspecified: Secondary | ICD-10-CM

## 2021-03-22 DIAGNOSIS — R1084 Generalized abdominal pain: Secondary | ICD-10-CM

## 2021-03-22 DIAGNOSIS — R634 Abnormal weight loss: Secondary | ICD-10-CM

## 2021-03-22 LAB — TSH: TSH: 3.62 u[IU]/mL (ref 0.35–5.50)

## 2021-03-22 MED ORDER — NA SULFATE-K SULFATE-MG SULF 17.5-3.13-1.6 GM/177ML PO SOLN: 1.0000 | ORAL | 0 refills | Status: DC

## 2021-03-22 NOTE — Progress Notes (Signed)
ASSESSMENT AND PLAN    # 71 yo female with generalized abdominal pain ( predominantly upper) and nausea for ~ one year with associated unintentional 25 pound weight loss.  --CT scan w.contrast [facility] for weight loss in August 2022 ( Care Everywhere) was unremarkable. Labs in ED November 2022 fairly unremarkable except for mild leukocytosis and minimally elevated lipase of 68. Despite weight loss her albumin was 3.6 --For further evaluation of nausea, upper abdominal pain and weight loss with proceed with an EGD. The risks and benefits of EGD with possible biopsies were discussed with the patient who agrees to proceed.  --PCP recently tried her on Zofran, carafate and bentyl but she stopped these due to constipation. She was also started on Pantoprazole but hasn't noticed any improvement in symptoms. I asked her to continue Protonix for now.  We may stop it at some point depending on EGD findings.  --Obtain TSH  # Chronic diarrhea ( years) , probably  functional.  Recent constipation related to multiple constipating medications in addition to stopping metformin. Now back with loose stool after stopping zofran, carafate and dicyclomine.  --Last colonoscopy was > 10 years ago. Will proceed with colonoscopy, mainly for colon cancer screening but will consider random colon biopsies to evaluate for microscopic colitis as cause for chronic loose stool  -- Metformin is still on hold.  # Colon cancer screening. She reports last colonoscopy at ~ 10 years ago.  --Schedule for a colonoscopy. The risks and benefits of colonoscopy with possible polypectomy / biopsies were discussed and the patient agrees to proceed.    # Anxiety   HISTORY OF PRESENT ILLNESS     Chief Complaint : nausea, weight loss, abdominal pain, chronic loose stool   LEOCADIA IDLEMAN is a 71 y.o. female with a past medical history significant for anxiety, DM, obesity, fibromyalgia, arthritis, depression, IBS, chronic loose stool,  cholecystectomy. See PMH below for any additional history.   Patient self referred for nausea, poor appetite, diarrhea, constipation, weight loss, abdominal pain . Followed years ago by Dr. Marina Goodell began having nausea and generalized abdominal pain about a year ago.  She seldom vomits. The abdominal pain is mainly in mid upper abdomen, non-radiating. Episodes occurs about 3 times week and may last several hours.  She thinks the pain gets better with eating so she forces herself to eat which is hard due to lack of appetite. She has lost 25 pounds unintentionally over the last 5 months. Leading up to onset of upper GI symptoms she had taken Meloxicam daily for years. She also takes a daily baby asa. She hasn't had any black stools .PCP prescribed Carafate, bentyl, and Zofran, and Protoinx in  October - November   [place] gives a history of chronic ( years) diarrhea present even before gallbladder removal many years ago. Sounds like she was diagnosed with IBS at some point. Generally takes probiotic and imodium to manage the diarrhea. PCP recently stopped Metformin ( maybe to see if it would help carafate, bentyl, and Zofran. Had to manually disimpact herself so she has stopped those medications.   PCP recently prescribed Pantoprazole and she is still taking that but hasn't noticed any improvement in upper Gi symptoms. PCP stopped Meloxicam.   Chyan says her blood sugars are good, in the 90's.   Last colonoscopy by Dr. Gala Romney she thinks was ~ 10 years  ago. She tried to get back in to see Dr Gala Romney for current GI issues  but wait was too long.    Data Reviewed:  CBC Latest Ref Rng & Units 12/29/2020  WBC 4.0 - 10.5 K/uL 11.8(H)  Hemoglobin 12.0 - 15.0 g/dL 15.8(H)  Hematocrit 36.0 - 46.0 % 47.9(H)  Platelets 150 - 400 K/uL 304    Lab Results  Component Value Date   LIPASE 68 (H) 12/29/2020     PREVIOUS GI EVALUATIONS:   Remote procedures with Dr.Rourk   Past Medical History:   Diagnosis Date   Anxiety    Arthritis    Depression    DM (diabetes mellitus) (HCC)    Fibromyalgia    Gallstones    GERD (gastroesophageal reflux disease)    HLD (hyperlipidemia)    HTN (hypertension)    IBS (irritable bowel syndrome)      Past Surgical History:  Procedure Laterality Date   CHOLECYSTECTOMY     KNEE CARTILAGE SURGERY Right    NASAL SEPTUM SURGERY Right    ROTATOR CUFF REPAIR Right    No family history on file. Social History   Tobacco Use   Smoking status: Never   Smokeless tobacco: Never  Vaping Use   Vaping Use: Never used  Substance Use Topics   Alcohol use: Not Currently   Current Outpatient Medications  Medication Sig Dispense Refill   ALPRAZolam (XANAX) 0.5 MG tablet Take 0.5 mg by mouth 2 (two) times daily.     aspirin EC 81 MG tablet Take 81 mg by mouth daily. Swallow whole.     atorvastatin (LIPITOR) 10 MG tablet Take 10 mg by mouth daily.     cetirizine (ZYRTEC ALLERGY) 10 MG tablet Take 10 mg by mouth daily.     glipiZIDE (GLUCOTROL XL) 2.5 MG 24 hr tablet Take 2.5 mg by mouth daily.     pantoprazole (PROTONIX) 40 MG tablet Take 40 mg by mouth daily.     promethazine (PHENERGAN) 25 MG tablet Take 12.5-25 mg by mouth 3 (three) times daily as needed.     dicyclomine (BENTYL) 10 MG capsule Take 10 mg by mouth 3 (three) times daily as needed for spasms (ibs). (Patient not taking: Reported on 03/22/2021)     diphenoxylate-atropine (LOMOTIL) 2.5-0.025 MG tablet Take 1 tablet by mouth 4 (four) times daily as needed for diarrhea or loose stools. (Patient not taking: Reported on 03/22/2021)     sucralfate (CARAFATE) 1 g tablet Take 1 g by mouth 4 (four) times daily. (Patient not taking: Reported on 03/22/2021)     No current facility-administered medications for this visit.   Allergies  Allergen Reactions   Codeine      Review of Systems: Positive for allergies / sinus trouble, anxiety, depression, fatigue, itching.  All other systems reviewed  and negative except where noted in HPI.    PHYSICAL EXAM :    Wt Readings from Last 3 Encounters:  03/22/21 150 lb 6 oz (68.2 kg)    BP 110/84 (BP Location: Left Arm, Patient Position: Sitting, Cuff Size: Normal)    Pulse 96    Ht 4' 10.25" (1.48 m) Comment: height measured without shoes   Wt 150 lb 6 oz (68.2 kg)    BMI 31.16 kg/m  Constitutional:  Generally well appearing female in no acute distress. Psychiatric: Pleasant. Normal mood and affect. Behavior is normal. EENT: Pupils normal.  Conjunctivae are normal. No scleral icterus. Neck supple.  Cardiovascular: Normal rate, regular rhythm. No edema Pulmonary/chest: Effort normal and breath sounds normal. No wheezing, rales or rhonchi. Abdominal: Soft,  nondistended, nontender. Bowel sounds active throughout. There are no masses palpable. No hepatomegaly. Neurological: Alert and oriented to person place and time. Skin: Skin is warm and dry. No rashes noted.  Tye Savoy, NP  03/22/2021, 10:39 AM  Cc:  Referring Provider Glenda Chroman, MD

## 2021-03-22 NOTE — Patient Instructions (Signed)
RECOMMENDATION(S):  Continue Pantoprazole 30 minutes before lunch.  PROCEDURES: You have been scheduled for an EGD and Colonoscopy. Please follow the written instructions given to you at your visit today. Please pick up your prep supplies at the pharmacy within the next 1-3 days. If you use inhalers (even only as needed), please bring them with you on the day of your procedure.  LABS:   Please proceed to the basement level for lab work before leaving today. Press "B" on the elevator. The lab is located at the first door on the left as you exit the elevator.  HEALTHCARE LAWS AND MY CHART RESULTS:   Due to recent changes in healthcare laws, you may see results of your imaging and/or laboratory studies on MyChart before I have had a chance to review them.  I understand that in some cases there may be results that are confusing or concerning to you. Please understand that not all results are received at the same time and often I may need to interpret multiple results in order to provide you with the best plan of care or course of treatment. Therefore, I ask that you please give me 48 hours to thoroughly review all your results before contacting my office for clarification.     BMI:  If you are age 40 or older, your body mass index should be between 23-30. Your Body mass index is 31.16 kg/m. If this is out of the aforementioned range listed, please consider follow up with your Primary Care Provider.  If you are age 29 or younger, your body mass index should be between 19-25. Your Body mass index is 31.16 kg/m. If this is out of the aformentioned range listed, please consider follow up with your Primary Care Provider.   MY CHART:  The [place] [facility] providers would like to encourage you to use [facility] to communicate with providers for non-urgent requests or questions.  Due to long hold times on the telephone, sending your provider a message by [facility] may be a faster and more efficient way to get a  response.  Please allow 48 business hours for a response.  Please remember that this is for non-urgent requests.   Thank you for trusting me with your gastrointestinal care!    Tye Savoy, NP

## 2021-03-23 NOTE — Progress Notes (Signed)
____________________________________________________________  Attending physician addendum:  Thank you for sending this case to me. I have reviewed the entire note and agree with the plan.   Kimberlee Shoun Danis, MD  ____________________________________________________________  

## 2021-03-24 ENCOUNTER — Ambulatory Visit: Payer: Medicare Other | Admitting: Gastroenterology

## 2021-04-01 DIAGNOSIS — E538 Deficiency of other specified B group vitamins: Secondary | ICD-10-CM | POA: Diagnosis not present

## 2021-04-01 DIAGNOSIS — J069 Acute upper respiratory infection, unspecified: Secondary | ICD-10-CM | POA: Diagnosis not present

## 2021-04-01 DIAGNOSIS — J029 Acute pharyngitis, unspecified: Secondary | ICD-10-CM | POA: Diagnosis not present

## 2021-04-01 DIAGNOSIS — R5383 Other fatigue: Secondary | ICD-10-CM | POA: Diagnosis not present

## 2021-04-01 DIAGNOSIS — Z299 Encounter for prophylactic measures, unspecified: Secondary | ICD-10-CM | POA: Diagnosis not present

## 2021-04-08 DIAGNOSIS — G510 Bell's palsy: Secondary | ICD-10-CM

## 2021-04-08 HISTORY — DX: Bell's palsy: G51.0

## 2021-04-11 DIAGNOSIS — R9431 Abnormal electrocardiogram [ECG] [EKG]: Secondary | ICD-10-CM | POA: Diagnosis not present

## 2021-04-11 DIAGNOSIS — E785 Hyperlipidemia, unspecified: Secondary | ICD-10-CM | POA: Diagnosis not present

## 2021-04-11 DIAGNOSIS — I10 Essential (primary) hypertension: Secondary | ICD-10-CM | POA: Diagnosis not present

## 2021-04-11 DIAGNOSIS — I6523 Occlusion and stenosis of bilateral carotid arteries: Secondary | ICD-10-CM | POA: Diagnosis not present

## 2021-04-11 DIAGNOSIS — G510 Bell's palsy: Secondary | ICD-10-CM | POA: Diagnosis not present

## 2021-04-11 DIAGNOSIS — Z20822 Contact with and (suspected) exposure to covid-19: Secondary | ICD-10-CM | POA: Diagnosis not present

## 2021-04-11 DIAGNOSIS — E876 Hypokalemia: Secondary | ICD-10-CM | POA: Diagnosis not present

## 2021-04-11 DIAGNOSIS — F419 Anxiety disorder, unspecified: Secondary | ICD-10-CM | POA: Diagnosis not present

## 2021-04-15 DIAGNOSIS — G510 Bell's palsy: Secondary | ICD-10-CM | POA: Diagnosis not present

## 2021-04-15 DIAGNOSIS — Z299 Encounter for prophylactic measures, unspecified: Secondary | ICD-10-CM | POA: Diagnosis not present

## 2021-04-15 DIAGNOSIS — I10 Essential (primary) hypertension: Secondary | ICD-10-CM | POA: Diagnosis not present

## 2021-04-15 DIAGNOSIS — E1165 Type 2 diabetes mellitus with hyperglycemia: Secondary | ICD-10-CM | POA: Diagnosis not present

## 2021-04-15 DIAGNOSIS — H6690 Otitis media, unspecified, unspecified ear: Secondary | ICD-10-CM | POA: Diagnosis not present

## 2021-04-29 ENCOUNTER — Encounter: Payer: Self-pay | Admitting: Gastroenterology

## 2021-04-29 ENCOUNTER — Ambulatory Visit (AMBULATORY_SURGERY_CENTER): Payer: Medicare Other | Admitting: Gastroenterology

## 2021-04-29 VITALS — BP 129/76 | HR 70 | Temp 98.4°F | Resp 9 | Ht <= 58 in | Wt 150.0 lb

## 2021-04-29 DIAGNOSIS — R1010 Upper abdominal pain, unspecified: Secondary | ICD-10-CM | POA: Diagnosis not present

## 2021-04-29 DIAGNOSIS — K2970 Gastritis, unspecified, without bleeding: Secondary | ICD-10-CM

## 2021-04-29 DIAGNOSIS — K529 Noninfective gastroenteritis and colitis, unspecified: Secondary | ICD-10-CM

## 2021-04-29 DIAGNOSIS — D123 Benign neoplasm of transverse colon: Secondary | ICD-10-CM | POA: Diagnosis not present

## 2021-04-29 DIAGNOSIS — D122 Benign neoplasm of ascending colon: Secondary | ICD-10-CM | POA: Diagnosis not present

## 2021-04-29 DIAGNOSIS — R634 Abnormal weight loss: Secondary | ICD-10-CM

## 2021-04-29 DIAGNOSIS — K2960 Other gastritis without bleeding: Secondary | ICD-10-CM | POA: Diagnosis not present

## 2021-04-29 MED ORDER — SODIUM CHLORIDE 0.9 % IV SOLN: 500.0000 mL | Freq: Once | INTRAVENOUS | Status: DC

## 2021-04-29 NOTE — Progress Notes (Signed)
History and Physical: ? This patient presents for endoscopic testing for: ?Encounter Diagnoses  ?Name Primary?  ? Weight loss, unintentional Yes  ? Chronic diarrhea   ? ? ?Clinical details in 03/22/21 [facility] office note - no changes since then. ?Abd pain and diarrhea and weight loss.. No source on labs or CTAP ?Patient reports nausea and abdominal pain are lately significantly improved. ?She feels phenergan caused side effects ? ?ROS: ?Patient denies chest pain or shortness of breath ? ? ?Past Medical History: ?Past Medical History:  ?Diagnosis Date  ? Anxiety   ? Arthritis   ? Bell's palsy 04/08/2021  ? Depression   ? DM (diabetes mellitus) ([place])   ? Fibromyalgia   ? Gallstones   ? HLD (hyperlipidemia)   ? HTN (hypertension)   ? IBS (irritable bowel syndrome)   ? ? ? ?Past Surgical History: ?Past Surgical History:  ?Procedure Laterality Date  ? CHOLECYSTECTOMY    ? COLONOSCOPY    ? KNEE CARTILAGE SURGERY Right   ? NASAL SEPTUM SURGERY Right   ? ROTATOR CUFF REPAIR Right   ? ? ?Allergies: ?Allergies  ?Allergen Reactions  ? Codeine   ? ? ?Outpatient Meds: ?Current Outpatient Medications  ?Medication Sig Dispense Refill  ? ALPRAZolam (XANAX) 0.5 MG tablet Take 0.5 mg by mouth 2 (two) times daily.    ? aspirin EC 81 MG tablet Take 81 mg by mouth daily. Swallow whole.    ? glipiZIDE (GLUCOTROL XL) 2.5 MG 24 hr tablet Take 2.5 mg by mouth daily.    ? atorvastatin (LIPITOR) 10 MG tablet Take 10 mg by mouth daily. (Patient not taking: Reported on 04/29/2021)    ? cetirizine (ZYRTEC) 10 MG tablet Take 10 mg by mouth daily.    ? dicyclomine (BENTYL) 10 MG capsule Take 10 mg by mouth 3 (three) times daily as needed for spasms (ibs). (Patient not taking: Reported on 03/22/2021)    ? diphenoxylate-atropine (LOMOTIL) 2.5-0.025 MG tablet Take 1 tablet by mouth 4 (four) times daily as needed for diarrhea or loose stools. (Patient not taking: Reported on 03/22/2021)    ? pantoprazole (PROTONIX) 40 MG tablet Take 40 mg by mouth daily.  (Patient not taking: Reported on 04/29/2021)    ? promethazine (PHENERGAN) 25 MG tablet Take 12.5-25 mg by mouth 3 (three) times daily as needed. (Patient not taking: Reported on 04/29/2021)    ? sucralfate (CARAFATE) 1 g tablet Take 1 g by mouth 4 (four) times daily. (Patient not taking: Reported on 03/22/2021)    ? ?Current Facility-Administered Medications  ?Medication Dose Route Frequency Provider Last Rate Last Admin  ? 0.9 %  sodium chloride infusion  500 mL Intravenous Once Doran Stabler, MD      ? ? ? ? ?___________________________________________________________________ ?Objective  ? ?Exam: ? ?BP 129/69   Pulse 88   Temp 98.4 ?F (36.9 ?C) (Temporal)   Ht '4\' 10"'$  (1.473 m)   Wt 150 lb (68 kg)   SpO2 96%   BMI 31.35 kg/m?  ? ?CV: RRR without murmur, S1/S2 ?Resp: clear to auscultation bilaterally, normal RR and effort noted ?GI: soft, no tenderness, with active bowel sounds. ? ? ?Assessment: ?Encounter Diagnoses  ?Name Primary?  ? Weight loss, unintentional Yes  ? Chronic diarrhea   ? ? ? ?Plan: ?Colonoscopy ?EGD ? ? ?The patient is appropriate for an endoscopic procedure in the ambulatory setting. ? ? - Wilfrid Lund, MD ? ? ? ? ?

## 2021-04-29 NOTE — Op Note (Signed)
[place] ?Patient Name: Tina Reid ?Procedure Date: 04/29/2021 1:25 PM ?MRN: [id number] ?Endoscopist: Estill Cotta. Loletha Carrow , MD ?Age: 71 ?Referring MD:  ?Date of Birth: 08/19/1950 ?Gender: Female ?Account #: [id number] ?Procedure:                Colonoscopy ?Indications:              Chronic diarrhea, Weight loss ?Medicines:                Monitored Anesthesia Care ?Procedure:                Pre-Anesthesia Assessment: ?                          - Prior to the procedure, a History and Physical  ?                          was performed, and patient medications and  ?                          allergies were reviewed. The patient's tolerance of  ?                          previous anesthesia was also reviewed. The risks  ?                          and benefits of the procedure and the sedation  ?                          options and risks were discussed with the patient.  ?                          All questions were answered, and informed consent  ?                          was obtained. Prior Anticoagulants: The patient has  ?                          taken no previous anticoagulant or antiplatelet  ?                          agents. ASA Grade Assessment: II - A patient with  ?                          mild systemic disease. After reviewing the risks  ?                          and benefits, the patient was deemed in  ?                          satisfactory condition to undergo the procedure. ?                          After obtaining informed consent, the colonoscope  ?  was passed under direct vision. Throughout the  ?                          procedure, the patient's blood pressure, pulse, and  ?                          oxygen saturations were monitored continuously. The  ?                          CF [id number] #[id number] was introduced through the anus  ?                          and advanced to the the terminal ileum, with  ?                          identification of the appendiceal  orifice and IC  ?                          valve. The colonoscopy was performed without  ?                          difficulty. The patient tolerated the procedure  ?                          well. The quality of the bowel preparation was  ?                          good. The terminal ileum, ileocecal valve,  ?                          appendiceal orifice, and rectum were photographed. ?Scope In: 2:55:12 PM ?Scope Out: 3:09:23 PM ?Scope Withdrawal Time: 0 hours 9 minutes 30 seconds  ?Total Procedure Duration: 0 hours 14 minutes 11 seconds  ?Findings:                 The digital rectal exam findings include decreased  ?                          sphincter tone. ?                          The terminal ileum appeared normal. ?                          Two semi-sessile polyps were found in the  ?                          transverse colon and ascending colon. The polyps  ?                          were 5 to 8 mm in size. These polyps were removed  ?                          with a cold snare. Resection and retrieval were  ?  complete. ?                          Normal mucosa was found in the entire colon.  ?                          Biopsies for histology were taken with a cold  ?                          forceps from the right colon and left colon for  ?                          evaluation of microscopic colitis. ?                          Internal hemorrhoids were found. ?                          The exam was otherwise without abnormality on  ?                          direct and retroflexion views. ?Complications:            No immediate complications. ?Estimated Blood Loss:     Estimated blood loss was minimal. ?Impression:               - Decreased sphincter tone found on digital rectal  ?                          exam. ?                          - The examined portion of the ileum was normal. ?                          - Two 5 to 8 mm polyps in the transverse colon and  ?                           in the ascending colon, removed with a cold snare.  ?                          Resected and retrieved. ?                          - Normal mucosa in the entire examined colon.  ?                          Biopsied. ?                          - Internal hemorrhoids. ?                          - The examination was otherwise normal on direct  ?  and retroflexion views. ?Recommendation:           - Patient has a contact number available for  ?                          emergencies. The signs and symptoms of potential  ?                          delayed complications were discussed with the  ?                          patient. Return to normal activities tomorrow.  ?                          Written discharge instructions were provided to the  ?                          patient. ?                          - Resume previous diet. ?                          - Continue present medications. ?                          - Await pathology results. ?                          - Repeat colonoscopy is recommended for  ?                          surveillance. The colonoscopy date will be  ?                          determined after pathology results from today's  ?                          exam become available for review. ?                          - See the other procedure note for documentation of  ?                          additional recommendations. ?Verble Styron L. Loletha Carrow, MD ?04/29/2021 3:14:20 PM ?This report has been signed electronically. ?

## 2021-04-29 NOTE — Progress Notes (Signed)
Called to room to assist during endoscopic procedure.  Patient ID and intended procedure confirmed with present staff. Received instructions for my participation in the procedure from the performing physician.  

## 2021-04-29 NOTE — Progress Notes (Signed)
VS completed by DT.    Medical history reviewed and updated.  

## 2021-04-29 NOTE — Progress Notes (Signed)
Pt in recovery with monitors in place, VSS. Report given to receiving RN. Bite guard was placed with pt awake to ensure comfort. No dental or soft tissue damage noted. 

## 2021-04-29 NOTE — Patient Instructions (Signed)
Handouts Provided:  Polyps and Gastritis ? ?YOU HAD AN ENDOSCOPIC PROCEDURE TODAY AT [facility]:   Refer to the procedure report that was given to you for any specific questions about what was found during the examination.  If the procedure report does not answer your questions, please call your gastroenterologist to clarify.  If you requested that your care partner not be given the details of your procedure findings, then the procedure report has been included in a sealed envelope for you to review at your convenience later. ? ?YOU SHOULD EXPECT: Some feelings of bloating in the abdomen. Passage of more gas than usual.  Walking can help get rid of the air that was put into your GI tract during the procedure and reduce the bloating. If you had a lower endoscopy (such as a colonoscopy or flexible sigmoidoscopy) you may notice spotting of blood in your stool or on the toilet paper. If you underwent a bowel prep for your procedure, you may not have a normal bowel movement for a few days. ? ?Please Note:  You might notice some irritation and congestion in your nose or some drainage.  This is from the oxygen used during your procedure.  There is no need for concern and it should clear up in a day or so. ? ?SYMPTOMS TO REPORT IMMEDIATELY: ? ?Following lower endoscopy (colonoscopy or flexible sigmoidoscopy): ? Excessive amounts of blood in the stool ? Significant tenderness or worsening of abdominal pains ? Swelling of the abdomen that is new, acute ? Fever of 100?F or higher ? ?Following upper endoscopy (EGD) ? Vomiting of blood or coffee ground material ? New chest pain or pain under the shoulder blades ? Painful or persistently difficult swallowing ? New shortness of breath ? Fever of 100?F or higher ? Black, tarry-looking stools ? ?For urgent or emergent issues, a gastroenterologist can be reached at any hour by calling [phone]. ?Do not use MyChart messaging for urgent concerns.  ? ? ?DIET:  We  do recommend a small meal at first, but then you may proceed to your regular diet.  Drink plenty of fluids but you should avoid alcoholic beverages for 24 hours. ? ?ACTIVITY:  You should plan to take it easy for the rest of today and you should NOT DRIVE or use heavy machinery until tomorrow (because of the sedation medicines used during the test).   ? ?FOLLOW UP: ?Our staff will call the number listed on your records 48-72 hours following your procedure to check on you and address any questions or concerns that you may have regarding the information given to you following your procedure. If we do not reach you, we will leave a message.  We will attempt to reach you two times.  During this call, we will ask if you have developed any symptoms of COVID 19. If you develop any symptoms (ie: fever, flu-like symptoms, shortness of breath, cough etc.) before then, please call [phone].  If you test positive for Covid 19 in the 2 weeks post procedure, please call and report this information to [place].   ? ?If any biopsies were taken you will be contacted by phone or by letter within the next 1-3 weeks.  Please call us at [phone] if you have not heard about the biopsies in 3 weeks.  ? ? ?SIGNATURES/CONFIDENTIALITY: ?You and/or your care partner have signed paperwork which will be entered into your electronic medical record.  These signatures attest to the fact that  that the information above on your After Visit Summary has been reviewed and is understood.  Full responsibility of the confidentiality of this discharge information lies with you and/or your care-partner. ? ?

## 2021-04-29 NOTE — Op Note (Signed)
[place] ?Patient Name: Tina Reid ?Procedure Date: 04/29/2021 1:26 PM ?MRN: [id number] ?Endoscopist: Estill Cotta. Loletha Carrow , MD ?Age: 71 ?Referring MD:  ?Date of Birth: [birth year] ?Gender: Female ?Account #: [id number] ?Procedure:                Upper GI endoscopy ?Indications:              Upper abdominal pain, Nausea ?                          Patient reports symptoms have significantly  ?                          improved since office visit. ?Medicines:                Monitored Anesthesia Care ?Procedure:                Pre-Anesthesia Assessment: ?                          - Prior to the procedure, a History and Physical  ?                          was performed, and patient medications and  ?                          allergies were reviewed. The patient's tolerance of  ?                          previous anesthesia was also reviewed. The risks  ?                          and benefits of the procedure and the sedation  ?                          options and risks were discussed with the patient.  ?                          All questions were answered, and informed consent  ?                          was obtained. Prior Anticoagulants: The patient has  ?                          taken no previous anticoagulant or antiplatelet  ?                          agents. ASA Grade Assessment: II - A patient with  ?                          mild systemic disease. After reviewing the risks  ?                          and benefits, the patient was deemed in  ?  satisfactory condition to undergo the procedure. ?                          - Prior to the procedure, a History and Physical  ?                          was performed, and patient medications and  ?                          allergies were reviewed. The patient's tolerance of  ?                          previous anesthesia was also reviewed. The risks  ?                          and benefits of the procedure and the sedation  ?                           options and risks were discussed with the patient.  ?                          All questions were answered, and informed consent  ?                          was obtained. Prior Anticoagulants: The patient has  ?                          taken no previous anticoagulant or antiplatelet  ?                          agents. ASA Grade Assessment: II - A patient with  ?                          mild systemic disease. After reviewing the risks  ?                          and benefits, the patient was deemed in  ?                          satisfactory condition to undergo the procedure. ?                          After obtaining informed consent, the endoscope was  ?                          passed under direct vision. Throughout the  ?                          procedure, the patient's blood pressure, pulse, and  ?                          oxygen saturations were monitored continuously. The  ?  Endoscope was introduced through the mouth, and  ?                          advanced to the second part of duodenum. The upper  ?                          GI endoscopy was accomplished without difficulty.  ?                          The patient tolerated the procedure well. ?Scope In: ?Scope Out: ?Findings:                 The larynx was normal. ?                          The esophagus was normal. ?                          A few diminutive erosions with no bleeding and no  ?                          stigmata of recent bleeding were found in the  ?                          gastric antrum. Biopsies were taken with a cold  ?                          forceps for histology. ?                          The exam of the stomach was otherwise normal. ?                          The examined duodenum was normal. ?Complications:            No immediate complications. ?Estimated Blood Loss:     Estimated blood loss was minimal. ?Impression:               - Normal larynx. ?                          - Normal  esophagus. ?                          - Erosive gastropathy with no bleeding and no  ?                          stigmata of recent bleeding. Biopsied. ?                          - Normal examined duodenum. ?                          If Bx negative for H pylori, then mild findings  ?                          likely from low  dose aspirin. Unlikely causing  ?                          pain, and patient is already on PPI. ?Recommendation:           - Patient has a contact number available for  ?                          emergencies. The signs and symptoms of potential  ?                          delayed complications were discussed with the  ?                          patient. Return to normal activities tomorrow.  ?                          Written discharge instructions were provided to the  ?                          patient. ?                          - Resume previous diet. ?                          - Continue present medications. ?                          - Await pathology results. ?                          - See the other procedure note for documentation of  ?                          additional recommendations. ?Travez Stancil L. Loletha Carrow, MD ?04/29/2021 3:25:12 PM ?This report has been signed electronically. ?

## 2021-05-03 ENCOUNTER — Telehealth: Payer: Self-pay | Admitting: *Deleted

## 2021-05-03 ENCOUNTER — Telehealth: Payer: Self-pay

## 2021-05-03 NOTE — Telephone Encounter (Signed)
Left message on follow up call. 

## 2021-05-03 NOTE — Telephone Encounter (Signed)
?  Follow up Call- ? ? ?  04/29/2021  ?  1:24 PM  ?Call back number  ?Post procedure Call Back phone  # [phone]  ?Permission to leave phone message Yes  ?  ? ?Patient questions: ? ?Do you have a fever, pain , or abdominal swelling? No. ?Pain Score  0 * ? ?Have you tolerated food without any problems? Yes.   ? ?Have you been able to return to your normal activities? Yes.   ? ?Do you have any questions about your discharge instructions: ?Diet   No. ?Medications  No. ?Follow up visit  No. ? ?Do you have questions or concerns about your Care? No. ? ?Actions: ?* If pain score is 4 or above: ?No action needed, pain <4. ? ? ?

## 2021-05-27 ENCOUNTER — Ambulatory Visit: Payer: Medicare Other | Admitting: Gastroenterology

## 2021-06-01 DIAGNOSIS — M7061 Trochanteric bursitis, right hip: Secondary | ICD-10-CM | POA: Diagnosis not present

## 2021-06-01 DIAGNOSIS — M7062 Trochanteric bursitis, left hip: Secondary | ICD-10-CM | POA: Diagnosis not present

## 2021-06-01 DIAGNOSIS — R29898 Other symptoms and signs involving the musculoskeletal system: Secondary | ICD-10-CM | POA: Diagnosis not present

## 2021-06-09 ENCOUNTER — Encounter: Payer: Self-pay | Admitting: Nurse Practitioner

## 2021-06-09 ENCOUNTER — Ambulatory Visit (INDEPENDENT_AMBULATORY_CARE_PROVIDER_SITE_OTHER): Payer: Medicare Other | Admitting: Nurse Practitioner

## 2021-06-09 VITALS — BP 128/80 | HR 82 | Ht 58.25 in | Wt 146.0 lb

## 2021-06-09 DIAGNOSIS — F32A Depression, unspecified: Secondary | ICD-10-CM

## 2021-06-09 DIAGNOSIS — R110 Nausea: Secondary | ICD-10-CM

## 2021-06-09 DIAGNOSIS — R634 Abnormal weight loss: Secondary | ICD-10-CM | POA: Diagnosis not present

## 2021-06-09 NOTE — Patient Instructions (Addendum)
[facility] [address], [place] [address] ?fax: [phone] ?[email or web address] ? ?If they do not take your insurance, please contact your insurance for a list of behavioral healthcare providers that are in network. ? ? ?Thank you for trusting me with your gastrointestinal care!   ? ?Tye Savoy, NP ? ? ? ? ?BMI: ? ?If you are age 72 or older, your body mass index should be between 23-30. Your Body mass index is 30.25 kg/m?Marland Kitchen If this is out of the aforementioned range listed, please consider follow up with your Primary Care Provider. ? ?If you are age 14 or younger, your body mass index should be between 19-25. Your Body mass index is 30.25 kg/m?Marland Kitchen If this is out of the aformentioned range listed, please consider follow up with your Primary Care Provider.  ? ?MY CHART: ? ?The [facility] providers would like to encourage you to use [facility] to communicate with providers for non-urgent requests or questions.  Due to long hold times on the telephone, sending your provider a message by [facility] may be a faster and more efficient way to get a response.  Please allow 48 business hours for a response.  Please remember that this is for non-urgent requests.  ? ? ? ?

## 2021-06-09 NOTE — Progress Notes (Signed)
? ? ?ASSESSMENT  ?Patient Profile:  ?Tina Reid is a 71 y.o. female known to Dr. Loletha Carrow with a past medical history of DM, arthritis,  chronic loose stool, cholecystectomy, fibromyalgia, anxiety, depression. Additional medical history as listed in [place] . ? ?#Nausea and poor appetite. Etiology unclear .  ?CT scan, labs, EGD with findings of erosive gastropathy and colon polyps but nothing to explain symptoms. CT scan w/ contrast at [facility] for weight loss in August 2022 was unremarkable. TSH normal. The majority of weight loss was a few months back. Now down only about 4 pounds in last 6 weeks.      ? ?  ?# Chronic diarrhea ( years).  ?No microscopic colitis on recent colonoscopy with random biopsies.   ?Not having diarrhea anymore, unsure why  Her stools are soft.  ? ?History of colon polyps. Two small tubular adenomas in March 2023. Surveillance colonoscopy due in 2028.  ? ?PLAN: ? ?She has Zofran at home and will take it as needed.  ?She want to stop Pantoprazole. Gastric biopsies were negative. She isn't not taking NSAIDS now. She has no GERD symptoms and doesn't think it helps nausea. Okay to stop Pantoprazole if she wants ?She is depressed but scared of taking medication. Had suicidal thoughts on Effexor. Maybe counseling. We gave her names of some Behavior Health Providers to contact.  ? ? ?History of present illness: ? ?Chief Complaint : follow up after EGD / colonoscopy ? ?She has no abdominal pain but continues to have occasional nausea. Still has no appetite. Has to force herself to eats. She admits to depression but doesn't want to be treated. Became suicidal on Effexor. Stools are soft, no longer having diarrhea. She wants to stop Pantoprazole. Not taking NSAIDS.  ? ?Laboratory data: ? ?  Latest Ref Rng & Units 12/29/2020  ?  3:35 PM  ?Hepatic Function  ?Total Protein 6.5 - 8.1 g/dL 6.1    ?Albumin 3.5 - 5.0 g/dL 3.6    ?AST 15 - 41 U/L 16    ?ALT 0 - 44 U/L 19    ?Alk Phosphatase 38 - 126 U/L 46     ?Total Bilirubin 0.3 - 1.2 mg/dL 0.7    ? ? ? ?  Latest Ref Rng & Units 12/29/2020  ?  3:35 PM  ?CBC  ?WBC 4.0 - 10.5 K/uL 11.8    ?Hemoglobin 12.0 - 15.0 g/dL 15.8    ?Hematocrit 36.0 - 46.0 % 47.9    ?Platelets 150 - 400 K/uL 304    ? ? ?Previous GI Evaluations: ?Endoscopies:  ?March 2023 EGD and colonoscopy for nausea, weight loss and diarrhea  ?-Erosive gastropathy ? ?-Decreased sphincter tone found on digital rectal exam. ?- The examined portion of the ileum was normal. ?- Two 5 to 8 mm polyps in the transverse colon and in the ascending colon, removed with a ?cold snare. Resected and retrieved. ?- Normal mucosa in the entire examined colon. Biopsied. ?- Internal hemorrhoids. ?- The examination was otherwise normal on direct and retroflexion views. ? ?Surgical [P], random colon biopsies ?- UNREMARKABLE COLONIC MUCOSA. ?- NO MICROSCOPIC COLITIS, ACTIVE INFLAMMATION OR CHRONIC CHANGES. ?2. Surgical [P], colon, transverse and ascending, polyp (2) ?- TUBULAR ADENOMA (2) WITHOUT HIGH GRADE DYSPLASIA. ?- MULTIPLE ADDITIONAL LEVELS EXAMINED. ?3. Surgical [P], gastric biopsies ?- UNREMARKABLE ANTRAL AND OXYNTIC MUCOSA. ?- NO HELICOBACTER PYLORI IDENTIFIED. ? ?Imaging:  ?August 2023 CTAP w/ contrast - Care Everywhere ?-Normal for age non contrast CT appearance of the  brain ? ?March 2023 CT head wo contrast ?-Normal for age non contrast CT appearance of the brain ? ?Past Medical History:  ?Diagnosis Date  ? Anxiety   ? Arthritis   ? Bell's palsy 04/08/2021  ? Depression   ? DM (diabetes mellitus) ([place])   ? Fibromyalgia   ? Gallstones   ? HLD (hyperlipidemia)   ? HTN (hypertension)   ? IBS (irritable bowel syndrome)   ? ? ?Past Surgical History:  ?Procedure Laterality Date  ? CHOLECYSTECTOMY    ? COLONOSCOPY    ? KNEE CARTILAGE SURGERY Right   ? NASAL SEPTUM SURGERY Right   ? ROTATOR CUFF REPAIR Right   ? ? ?Current Medications, Allergies, Family History and Social History were reviewed in [employer] record. ?  ?  ?Current Outpatient Medications  ?Medication Sig Dispense Refill  ? ALPRAZolam (XANAX) 0.5 MG tablet Take 0.5 mg by mouth 2 (two) times daily.    ? cetirizine (ZYRTEC) 10 MG tablet Take 10 mg by mouth daily.    ? glipiZIDE (GLUCOTROL XL) 2.5 MG 24 hr tablet Take 2.5 mg by mouth daily.    ? pantoprazole (PROTONIX) 40 MG tablet Take 40 mg by mouth daily.    ? ?No current facility-administered medications for this visit.  ? ? ?Review of Systems: ?No chest pain. No shortness of breath. No urinary complaints.  ? ? ?Physical Exam ? ?Wt Readings from Last 3 Encounters:  ?06/09/21 146 lb (66.2 kg)  ?04/29/21 150 lb (68 kg)  ?03/22/21 150 lb 6 oz (68.2 kg)  ? ? ?BP 128/80   Pulse 82   Ht 4' 10.25" (1.48 m)   Wt 146 lb (66.2 kg)   SpO2 96%   BMI 30.25 kg/m?  ?Constitutional:  Generally well appearing female in no acute distress. ?Psychiatric: Flat affect. Normal mood and affect. Behavior is normal. ?EENT: Pupils normal.  Conjunctivae are normal. No scleral icterus. ?Neck supple.  ?Cardiovascular: Normal rate, regular rhythm.  ?Pulmonary/chest: Effort normal Abdominal: Soft, nondistended, nontender.  There are no masses palpable. No hepatomegaly. ?Neurological: Alert and oriented to person place and time. ?Skin: Skin is warm and dry. No rashes noted. ? ?Tye Savoy, NP  06/09/2021, 2:20 PM ? ? ? ? ? ? ? ? ? ?

## 2021-06-11 DIAGNOSIS — M25551 Pain in right hip: Secondary | ICD-10-CM | POA: Diagnosis not present

## 2021-06-14 DIAGNOSIS — M48061 Spinal stenosis, lumbar region without neurogenic claudication: Secondary | ICD-10-CM | POA: Diagnosis not present

## 2021-06-14 DIAGNOSIS — M25551 Pain in right hip: Secondary | ICD-10-CM | POA: Diagnosis not present

## 2021-06-14 DIAGNOSIS — R29898 Other symptoms and signs involving the musculoskeletal system: Secondary | ICD-10-CM | POA: Diagnosis not present

## 2021-06-14 NOTE — Progress Notes (Signed)
____________________________________________________________ ? ?Attending physician addendum: ? ?Thank you for sending this case to me. ?I have reviewed the entire note and agree with the plan. ? ?As you note, and extensive work-up has not revealed any visible digestive cause for her nausea and weight loss.  This is not typical for gastroparesis or SIBO however, a trial of rifaximin would be reasonable.  It may be related to her affective disorder. ? ?Wilfrid Lund, MD ? ?____________________________________________________________ ? ?

## 2021-06-16 DIAGNOSIS — M47816 Spondylosis without myelopathy or radiculopathy, lumbar region: Secondary | ICD-10-CM | POA: Diagnosis not present

## 2021-06-16 DIAGNOSIS — Z7901 Long term (current) use of anticoagulants: Secondary | ICD-10-CM | POA: Diagnosis not present

## 2021-06-16 DIAGNOSIS — Z20822 Contact with and (suspected) exposure to covid-19: Secondary | ICD-10-CM | POA: Diagnosis present

## 2021-06-16 DIAGNOSIS — M25451 Effusion, right hip: Secondary | ICD-10-CM | POA: Diagnosis not present

## 2021-06-16 DIAGNOSIS — R110 Nausea: Secondary | ICD-10-CM | POA: Diagnosis not present

## 2021-06-16 DIAGNOSIS — M1611 Unilateral primary osteoarthritis, right hip: Secondary | ICD-10-CM | POA: Diagnosis not present

## 2021-06-16 DIAGNOSIS — R001 Bradycardia, unspecified: Secondary | ICD-10-CM | POA: Diagnosis not present

## 2021-06-16 DIAGNOSIS — Z9889 Other specified postprocedural states: Secondary | ICD-10-CM | POA: Diagnosis not present

## 2021-06-16 DIAGNOSIS — M48061 Spinal stenosis, lumbar region without neurogenic claudication: Secondary | ICD-10-CM | POA: Diagnosis present

## 2021-06-16 DIAGNOSIS — Z791 Long term (current) use of non-steroidal anti-inflammatories (NSAID): Secondary | ICD-10-CM | POA: Diagnosis not present

## 2021-06-16 DIAGNOSIS — S72001A Fracture of unspecified part of neck of right femur, initial encounter for closed fracture: Secondary | ICD-10-CM | POA: Diagnosis not present

## 2021-06-16 DIAGNOSIS — E119 Type 2 diabetes mellitus without complications: Secondary | ICD-10-CM | POA: Diagnosis present

## 2021-06-16 DIAGNOSIS — I10 Essential (primary) hypertension: Secondary | ICD-10-CM | POA: Diagnosis present

## 2021-06-16 DIAGNOSIS — F32A Depression, unspecified: Secondary | ICD-10-CM | POA: Diagnosis present

## 2021-06-16 DIAGNOSIS — F419 Anxiety disorder, unspecified: Secondary | ICD-10-CM | POA: Diagnosis present

## 2021-06-16 DIAGNOSIS — Z79899 Other long term (current) drug therapy: Secondary | ICD-10-CM | POA: Diagnosis not present

## 2021-06-16 DIAGNOSIS — S72011D Unspecified intracapsular fracture of right femur, subsequent encounter for closed fracture with routine healing: Secondary | ICD-10-CM | POA: Diagnosis not present

## 2021-06-16 DIAGNOSIS — F319 Bipolar disorder, unspecified: Secondary | ICD-10-CM | POA: Diagnosis not present

## 2021-06-16 DIAGNOSIS — X58XXXD Exposure to other specified factors, subsequent encounter: Secondary | ICD-10-CM | POA: Diagnosis not present

## 2021-06-16 DIAGNOSIS — S72011S Unspecified intracapsular fracture of right femur, sequela: Secondary | ICD-10-CM | POA: Diagnosis not present

## 2021-06-16 DIAGNOSIS — S72011A Unspecified intracapsular fracture of right femur, initial encounter for closed fracture: Secondary | ICD-10-CM | POA: Diagnosis present

## 2021-06-16 DIAGNOSIS — Z885 Allergy status to narcotic agent status: Secondary | ICD-10-CM | POA: Diagnosis not present

## 2021-06-16 DIAGNOSIS — Z7982 Long term (current) use of aspirin: Secondary | ICD-10-CM | POA: Diagnosis not present

## 2021-06-16 DIAGNOSIS — W19XXXD Unspecified fall, subsequent encounter: Secondary | ICD-10-CM | POA: Diagnosis not present

## 2021-06-16 DIAGNOSIS — Z01818 Encounter for other preprocedural examination: Secondary | ICD-10-CM | POA: Diagnosis not present

## 2021-06-16 DIAGNOSIS — Z7952 Long term (current) use of systemic steroids: Secondary | ICD-10-CM | POA: Diagnosis not present

## 2021-06-16 DIAGNOSIS — Z7984 Long term (current) use of oral hypoglycemic drugs: Secondary | ICD-10-CM | POA: Diagnosis not present

## 2021-06-16 DIAGNOSIS — K589 Irritable bowel syndrome without diarrhea: Secondary | ICD-10-CM | POA: Diagnosis present

## 2021-06-19 DIAGNOSIS — S72011D Unspecified intracapsular fracture of right femur, subsequent encounter for closed fracture with routine healing: Secondary | ICD-10-CM | POA: Diagnosis not present

## 2021-06-19 DIAGNOSIS — R001 Bradycardia, unspecified: Secondary | ICD-10-CM | POA: Diagnosis not present

## 2021-06-19 DIAGNOSIS — F419 Anxiety disorder, unspecified: Secondary | ICD-10-CM | POA: Diagnosis present

## 2021-06-19 DIAGNOSIS — W19XXXD Unspecified fall, subsequent encounter: Secondary | ICD-10-CM | POA: Diagnosis not present

## 2021-06-19 DIAGNOSIS — Z79899 Other long term (current) drug therapy: Secondary | ICD-10-CM | POA: Diagnosis not present

## 2021-06-19 DIAGNOSIS — E119 Type 2 diabetes mellitus without complications: Secondary | ICD-10-CM | POA: Diagnosis present

## 2021-06-19 DIAGNOSIS — F319 Bipolar disorder, unspecified: Secondary | ICD-10-CM | POA: Diagnosis not present

## 2021-06-19 DIAGNOSIS — Z01818 Encounter for other preprocedural examination: Secondary | ICD-10-CM | POA: Diagnosis not present

## 2021-06-19 DIAGNOSIS — Z7901 Long term (current) use of anticoagulants: Secondary | ICD-10-CM | POA: Diagnosis not present

## 2021-06-19 DIAGNOSIS — S72011S Unspecified intracapsular fracture of right femur, sequela: Secondary | ICD-10-CM | POA: Diagnosis not present

## 2021-06-19 DIAGNOSIS — F32A Depression, unspecified: Secondary | ICD-10-CM | POA: Diagnosis present

## 2021-06-19 DIAGNOSIS — R110 Nausea: Secondary | ICD-10-CM | POA: Diagnosis not present

## 2021-06-19 DIAGNOSIS — S72001A Fracture of unspecified part of neck of right femur, initial encounter for closed fracture: Secondary | ICD-10-CM | POA: Diagnosis not present

## 2021-06-19 DIAGNOSIS — Z7982 Long term (current) use of aspirin: Secondary | ICD-10-CM | POA: Diagnosis not present

## 2021-06-19 DIAGNOSIS — S72011A Unspecified intracapsular fracture of right femur, initial encounter for closed fracture: Secondary | ICD-10-CM | POA: Diagnosis present

## 2021-06-19 DIAGNOSIS — Z9889 Other specified postprocedural states: Secondary | ICD-10-CM | POA: Diagnosis not present

## 2021-06-19 DIAGNOSIS — I10 Essential (primary) hypertension: Secondary | ICD-10-CM | POA: Diagnosis present

## 2021-06-19 DIAGNOSIS — M48061 Spinal stenosis, lumbar region without neurogenic claudication: Secondary | ICD-10-CM | POA: Diagnosis present

## 2021-06-19 DIAGNOSIS — Z885 Allergy status to narcotic agent status: Secondary | ICD-10-CM | POA: Diagnosis not present

## 2021-06-19 DIAGNOSIS — X58XXXD Exposure to other specified factors, subsequent encounter: Secondary | ICD-10-CM | POA: Diagnosis not present

## 2021-06-19 DIAGNOSIS — Z7984 Long term (current) use of oral hypoglycemic drugs: Secondary | ICD-10-CM | POA: Diagnosis not present

## 2021-06-19 DIAGNOSIS — K589 Irritable bowel syndrome without diarrhea: Secondary | ICD-10-CM | POA: Diagnosis present

## 2021-06-19 DIAGNOSIS — M47816 Spondylosis without myelopathy or radiculopathy, lumbar region: Secondary | ICD-10-CM | POA: Diagnosis not present

## 2021-06-19 DIAGNOSIS — Z20822 Contact with and (suspected) exposure to covid-19: Secondary | ICD-10-CM | POA: Diagnosis present

## 2021-06-19 DIAGNOSIS — Z7952 Long term (current) use of systemic steroids: Secondary | ICD-10-CM | POA: Diagnosis not present

## 2021-06-19 DIAGNOSIS — Z791 Long term (current) use of non-steroidal anti-inflammatories (NSAID): Secondary | ICD-10-CM | POA: Diagnosis not present

## 2021-06-20 DIAGNOSIS — S72001A Fracture of unspecified part of neck of right femur, initial encounter for closed fracture: Secondary | ICD-10-CM | POA: Diagnosis not present

## 2021-06-20 DIAGNOSIS — E119 Type 2 diabetes mellitus without complications: Secondary | ICD-10-CM | POA: Diagnosis not present

## 2021-06-20 DIAGNOSIS — K589 Irritable bowel syndrome without diarrhea: Secondary | ICD-10-CM | POA: Diagnosis not present

## 2021-06-20 DIAGNOSIS — Z79899 Other long term (current) drug therapy: Secondary | ICD-10-CM | POA: Diagnosis not present

## 2021-06-20 DIAGNOSIS — F419 Anxiety disorder, unspecified: Secondary | ICD-10-CM | POA: Diagnosis not present

## 2021-06-20 DIAGNOSIS — W19XXXD Unspecified fall, subsequent encounter: Secondary | ICD-10-CM | POA: Diagnosis not present

## 2021-06-20 DIAGNOSIS — I10 Essential (primary) hypertension: Secondary | ICD-10-CM | POA: Diagnosis not present

## 2021-06-20 DIAGNOSIS — S72011D Unspecified intracapsular fracture of right femur, subsequent encounter for closed fracture with routine healing: Secondary | ICD-10-CM | POA: Diagnosis not present

## 2021-06-20 DIAGNOSIS — R110 Nausea: Secondary | ICD-10-CM | POA: Diagnosis not present

## 2021-06-21 DIAGNOSIS — S72011A Unspecified intracapsular fracture of right femur, initial encounter for closed fracture: Secondary | ICD-10-CM | POA: Diagnosis not present

## 2021-06-21 DIAGNOSIS — M47816 Spondylosis without myelopathy or radiculopathy, lumbar region: Secondary | ICD-10-CM | POA: Diagnosis not present

## 2021-06-23 DIAGNOSIS — Z9889 Other specified postprocedural states: Secondary | ICD-10-CM | POA: Diagnosis not present

## 2021-06-23 DIAGNOSIS — S72001A Fracture of unspecified part of neck of right femur, initial encounter for closed fracture: Secondary | ICD-10-CM | POA: Diagnosis not present

## 2021-06-25 DIAGNOSIS — Z791 Long term (current) use of non-steroidal anti-inflammatories (NSAID): Secondary | ICD-10-CM | POA: Diagnosis not present

## 2021-06-25 DIAGNOSIS — S72011D Unspecified intracapsular fracture of right femur, subsequent encounter for closed fracture with routine healing: Secondary | ICD-10-CM | POA: Diagnosis not present

## 2021-06-25 DIAGNOSIS — Z9181 History of falling: Secondary | ICD-10-CM | POA: Diagnosis not present

## 2021-06-25 DIAGNOSIS — F419 Anxiety disorder, unspecified: Secondary | ICD-10-CM | POA: Diagnosis not present

## 2021-06-25 DIAGNOSIS — I10 Essential (primary) hypertension: Secondary | ICD-10-CM | POA: Diagnosis not present

## 2021-06-25 DIAGNOSIS — W19XXXD Unspecified fall, subsequent encounter: Secondary | ICD-10-CM | POA: Diagnosis not present

## 2021-06-25 DIAGNOSIS — Z7984 Long term (current) use of oral hypoglycemic drugs: Secondary | ICD-10-CM | POA: Diagnosis not present

## 2021-06-25 DIAGNOSIS — F32A Depression, unspecified: Secondary | ICD-10-CM | POA: Diagnosis not present

## 2021-06-25 DIAGNOSIS — M7061 Trochanteric bursitis, right hip: Secondary | ICD-10-CM | POA: Diagnosis not present

## 2021-06-28 DIAGNOSIS — M7061 Trochanteric bursitis, right hip: Secondary | ICD-10-CM | POA: Diagnosis not present

## 2021-06-28 DIAGNOSIS — F32A Depression, unspecified: Secondary | ICD-10-CM | POA: Diagnosis not present

## 2021-06-28 DIAGNOSIS — S72011D Unspecified intracapsular fracture of right femur, subsequent encounter for closed fracture with routine healing: Secondary | ICD-10-CM | POA: Diagnosis not present

## 2021-06-28 DIAGNOSIS — W19XXXD Unspecified fall, subsequent encounter: Secondary | ICD-10-CM | POA: Diagnosis not present

## 2021-06-28 DIAGNOSIS — I10 Essential (primary) hypertension: Secondary | ICD-10-CM | POA: Diagnosis not present

## 2021-06-28 DIAGNOSIS — F419 Anxiety disorder, unspecified: Secondary | ICD-10-CM | POA: Diagnosis not present

## 2021-06-29 DIAGNOSIS — W19XXXD Unspecified fall, subsequent encounter: Secondary | ICD-10-CM | POA: Diagnosis not present

## 2021-06-29 DIAGNOSIS — Z299 Encounter for prophylactic measures, unspecified: Secondary | ICD-10-CM | POA: Diagnosis not present

## 2021-06-29 DIAGNOSIS — S72011D Unspecified intracapsular fracture of right femur, subsequent encounter for closed fracture with routine healing: Secondary | ICD-10-CM | POA: Diagnosis not present

## 2021-06-29 DIAGNOSIS — M7061 Trochanteric bursitis, right hip: Secondary | ICD-10-CM | POA: Diagnosis not present

## 2021-06-29 DIAGNOSIS — I10 Essential (primary) hypertension: Secondary | ICD-10-CM | POA: Diagnosis not present

## 2021-06-29 DIAGNOSIS — F419 Anxiety disorder, unspecified: Secondary | ICD-10-CM | POA: Diagnosis not present

## 2021-06-29 DIAGNOSIS — Z09 Encounter for follow-up examination after completed treatment for conditions other than malignant neoplasm: Secondary | ICD-10-CM | POA: Diagnosis not present

## 2021-06-29 DIAGNOSIS — F32A Depression, unspecified: Secondary | ICD-10-CM | POA: Diagnosis not present

## 2021-06-29 DIAGNOSIS — Z8781 Personal history of (healed) traumatic fracture: Secondary | ICD-10-CM | POA: Diagnosis not present

## 2021-07-01 DIAGNOSIS — W19XXXD Unspecified fall, subsequent encounter: Secondary | ICD-10-CM | POA: Diagnosis not present

## 2021-07-01 DIAGNOSIS — F419 Anxiety disorder, unspecified: Secondary | ICD-10-CM | POA: Diagnosis not present

## 2021-07-01 DIAGNOSIS — M7061 Trochanteric bursitis, right hip: Secondary | ICD-10-CM | POA: Diagnosis not present

## 2021-07-01 DIAGNOSIS — F32A Depression, unspecified: Secondary | ICD-10-CM | POA: Diagnosis not present

## 2021-07-01 DIAGNOSIS — I10 Essential (primary) hypertension: Secondary | ICD-10-CM | POA: Diagnosis not present

## 2021-07-01 DIAGNOSIS — S72011D Unspecified intracapsular fracture of right femur, subsequent encounter for closed fracture with routine healing: Secondary | ICD-10-CM | POA: Diagnosis not present

## 2021-07-07 DIAGNOSIS — S72011D Unspecified intracapsular fracture of right femur, subsequent encounter for closed fracture with routine healing: Secondary | ICD-10-CM | POA: Diagnosis not present

## 2021-07-07 DIAGNOSIS — I10 Essential (primary) hypertension: Secondary | ICD-10-CM | POA: Diagnosis not present

## 2021-07-07 DIAGNOSIS — F32A Depression, unspecified: Secondary | ICD-10-CM | POA: Diagnosis not present

## 2021-07-07 DIAGNOSIS — M7061 Trochanteric bursitis, right hip: Secondary | ICD-10-CM | POA: Diagnosis not present

## 2021-07-07 DIAGNOSIS — M25561 Pain in right knee: Secondary | ICD-10-CM | POA: Diagnosis not present

## 2021-07-07 DIAGNOSIS — M25551 Pain in right hip: Secondary | ICD-10-CM | POA: Diagnosis not present

## 2021-07-07 DIAGNOSIS — F419 Anxiety disorder, unspecified: Secondary | ICD-10-CM | POA: Diagnosis not present

## 2021-07-07 DIAGNOSIS — W19XXXD Unspecified fall, subsequent encounter: Secondary | ICD-10-CM | POA: Diagnosis not present

## 2021-07-08 DIAGNOSIS — S72011D Unspecified intracapsular fracture of right femur, subsequent encounter for closed fracture with routine healing: Secondary | ICD-10-CM | POA: Diagnosis not present

## 2021-07-09 DIAGNOSIS — F32A Depression, unspecified: Secondary | ICD-10-CM | POA: Diagnosis not present

## 2021-07-09 DIAGNOSIS — W19XXXD Unspecified fall, subsequent encounter: Secondary | ICD-10-CM | POA: Diagnosis not present

## 2021-07-09 DIAGNOSIS — S72011D Unspecified intracapsular fracture of right femur, subsequent encounter for closed fracture with routine healing: Secondary | ICD-10-CM | POA: Diagnosis not present

## 2021-07-09 DIAGNOSIS — M7061 Trochanteric bursitis, right hip: Secondary | ICD-10-CM | POA: Diagnosis not present

## 2021-07-09 DIAGNOSIS — I10 Essential (primary) hypertension: Secondary | ICD-10-CM | POA: Diagnosis not present

## 2021-07-09 DIAGNOSIS — F419 Anxiety disorder, unspecified: Secondary | ICD-10-CM | POA: Diagnosis not present

## 2021-07-12 DIAGNOSIS — F32A Depression, unspecified: Secondary | ICD-10-CM | POA: Diagnosis not present

## 2021-07-12 DIAGNOSIS — F419 Anxiety disorder, unspecified: Secondary | ICD-10-CM | POA: Diagnosis not present

## 2021-07-12 DIAGNOSIS — S72011D Unspecified intracapsular fracture of right femur, subsequent encounter for closed fracture with routine healing: Secondary | ICD-10-CM | POA: Diagnosis not present

## 2021-07-12 DIAGNOSIS — W19XXXD Unspecified fall, subsequent encounter: Secondary | ICD-10-CM | POA: Diagnosis not present

## 2021-07-12 DIAGNOSIS — I10 Essential (primary) hypertension: Secondary | ICD-10-CM | POA: Diagnosis not present

## 2021-07-12 DIAGNOSIS — M7061 Trochanteric bursitis, right hip: Secondary | ICD-10-CM | POA: Diagnosis not present

## 2021-07-15 DIAGNOSIS — F419 Anxiety disorder, unspecified: Secondary | ICD-10-CM | POA: Diagnosis not present

## 2021-07-15 DIAGNOSIS — F32A Depression, unspecified: Secondary | ICD-10-CM | POA: Diagnosis not present

## 2021-07-15 DIAGNOSIS — M7061 Trochanteric bursitis, right hip: Secondary | ICD-10-CM | POA: Diagnosis not present

## 2021-07-15 DIAGNOSIS — S72011D Unspecified intracapsular fracture of right femur, subsequent encounter for closed fracture with routine healing: Secondary | ICD-10-CM | POA: Diagnosis not present

## 2021-07-15 DIAGNOSIS — W19XXXD Unspecified fall, subsequent encounter: Secondary | ICD-10-CM | POA: Diagnosis not present

## 2021-07-15 DIAGNOSIS — I10 Essential (primary) hypertension: Secondary | ICD-10-CM | POA: Diagnosis not present

## 2021-07-22 DIAGNOSIS — F32A Depression, unspecified: Secondary | ICD-10-CM | POA: Diagnosis not present

## 2021-07-22 DIAGNOSIS — S72011D Unspecified intracapsular fracture of right femur, subsequent encounter for closed fracture with routine healing: Secondary | ICD-10-CM | POA: Diagnosis not present

## 2021-07-22 DIAGNOSIS — W19XXXD Unspecified fall, subsequent encounter: Secondary | ICD-10-CM | POA: Diagnosis not present

## 2021-07-22 DIAGNOSIS — F419 Anxiety disorder, unspecified: Secondary | ICD-10-CM | POA: Diagnosis not present

## 2021-07-22 DIAGNOSIS — I10 Essential (primary) hypertension: Secondary | ICD-10-CM | POA: Diagnosis not present

## 2021-07-22 DIAGNOSIS — M25551 Pain in right hip: Secondary | ICD-10-CM | POA: Diagnosis not present

## 2021-07-22 DIAGNOSIS — M7061 Trochanteric bursitis, right hip: Secondary | ICD-10-CM | POA: Diagnosis not present

## 2021-07-25 DIAGNOSIS — I10 Essential (primary) hypertension: Secondary | ICD-10-CM | POA: Diagnosis not present

## 2021-07-25 DIAGNOSIS — Z7984 Long term (current) use of oral hypoglycemic drugs: Secondary | ICD-10-CM | POA: Diagnosis not present

## 2021-07-25 DIAGNOSIS — F419 Anxiety disorder, unspecified: Secondary | ICD-10-CM | POA: Diagnosis not present

## 2021-07-25 DIAGNOSIS — Z9181 History of falling: Secondary | ICD-10-CM | POA: Diagnosis not present

## 2021-07-25 DIAGNOSIS — S72011D Unspecified intracapsular fracture of right femur, subsequent encounter for closed fracture with routine healing: Secondary | ICD-10-CM | POA: Diagnosis not present

## 2021-07-25 DIAGNOSIS — W19XXXD Unspecified fall, subsequent encounter: Secondary | ICD-10-CM | POA: Diagnosis not present

## 2021-07-25 DIAGNOSIS — Z791 Long term (current) use of non-steroidal anti-inflammatories (NSAID): Secondary | ICD-10-CM | POA: Diagnosis not present

## 2021-07-25 DIAGNOSIS — M7061 Trochanteric bursitis, right hip: Secondary | ICD-10-CM | POA: Diagnosis not present

## 2021-07-25 DIAGNOSIS — F32A Depression, unspecified: Secondary | ICD-10-CM | POA: Diagnosis not present

## 2021-07-28 DIAGNOSIS — W19XXXD Unspecified fall, subsequent encounter: Secondary | ICD-10-CM | POA: Diagnosis not present

## 2021-07-28 DIAGNOSIS — F419 Anxiety disorder, unspecified: Secondary | ICD-10-CM | POA: Diagnosis not present

## 2021-07-28 DIAGNOSIS — M7061 Trochanteric bursitis, right hip: Secondary | ICD-10-CM | POA: Diagnosis not present

## 2021-07-28 DIAGNOSIS — F32A Depression, unspecified: Secondary | ICD-10-CM | POA: Diagnosis not present

## 2021-07-28 DIAGNOSIS — I10 Essential (primary) hypertension: Secondary | ICD-10-CM | POA: Diagnosis not present

## 2021-07-28 DIAGNOSIS — S72011D Unspecified intracapsular fracture of right femur, subsequent encounter for closed fracture with routine healing: Secondary | ICD-10-CM | POA: Diagnosis not present

## 2021-08-04 DIAGNOSIS — M7061 Trochanteric bursitis, right hip: Secondary | ICD-10-CM | POA: Diagnosis not present

## 2021-08-04 DIAGNOSIS — F32A Depression, unspecified: Secondary | ICD-10-CM | POA: Diagnosis not present

## 2021-08-04 DIAGNOSIS — W19XXXD Unspecified fall, subsequent encounter: Secondary | ICD-10-CM | POA: Diagnosis not present

## 2021-08-04 DIAGNOSIS — F419 Anxiety disorder, unspecified: Secondary | ICD-10-CM | POA: Diagnosis not present

## 2021-08-04 DIAGNOSIS — I10 Essential (primary) hypertension: Secondary | ICD-10-CM | POA: Diagnosis not present

## 2021-08-04 DIAGNOSIS — S72011D Unspecified intracapsular fracture of right femur, subsequent encounter for closed fracture with routine healing: Secondary | ICD-10-CM | POA: Diagnosis not present

## 2021-08-05 DIAGNOSIS — I10 Essential (primary) hypertension: Secondary | ICD-10-CM | POA: Diagnosis not present

## 2021-08-05 DIAGNOSIS — Z8781 Personal history of (healed) traumatic fracture: Secondary | ICD-10-CM | POA: Diagnosis not present

## 2021-08-05 DIAGNOSIS — Z299 Encounter for prophylactic measures, unspecified: Secondary | ICD-10-CM | POA: Diagnosis not present

## 2021-08-05 DIAGNOSIS — E1165 Type 2 diabetes mellitus with hyperglycemia: Secondary | ICD-10-CM | POA: Diagnosis not present

## 2021-08-05 DIAGNOSIS — H53149 Visual discomfort, unspecified: Secondary | ICD-10-CM | POA: Diagnosis not present

## 2021-08-13 DIAGNOSIS — F419 Anxiety disorder, unspecified: Secondary | ICD-10-CM | POA: Diagnosis not present

## 2021-08-13 DIAGNOSIS — S72011D Unspecified intracapsular fracture of right femur, subsequent encounter for closed fracture with routine healing: Secondary | ICD-10-CM | POA: Diagnosis not present

## 2021-08-13 DIAGNOSIS — F32A Depression, unspecified: Secondary | ICD-10-CM | POA: Diagnosis not present

## 2021-08-13 DIAGNOSIS — I10 Essential (primary) hypertension: Secondary | ICD-10-CM | POA: Diagnosis not present

## 2021-08-13 DIAGNOSIS — M7061 Trochanteric bursitis, right hip: Secondary | ICD-10-CM | POA: Diagnosis not present

## 2021-08-13 DIAGNOSIS — W19XXXD Unspecified fall, subsequent encounter: Secondary | ICD-10-CM | POA: Diagnosis not present

## 2021-08-16 DIAGNOSIS — H40053 Ocular hypertension, bilateral: Secondary | ICD-10-CM | POA: Diagnosis not present

## 2021-08-16 DIAGNOSIS — H40013 Open angle with borderline findings, low risk, bilateral: Secondary | ICD-10-CM | POA: Diagnosis not present

## 2021-08-16 DIAGNOSIS — H26493 Other secondary cataract, bilateral: Secondary | ICD-10-CM | POA: Diagnosis not present

## 2021-08-16 DIAGNOSIS — H04123 Dry eye syndrome of bilateral lacrimal glands: Secondary | ICD-10-CM | POA: Diagnosis not present

## 2021-08-16 DIAGNOSIS — Z961 Presence of intraocular lens: Secondary | ICD-10-CM | POA: Diagnosis not present

## 2021-08-16 DIAGNOSIS — H16223 Keratoconjunctivitis sicca, not specified as Sjogren's, bilateral: Secondary | ICD-10-CM | POA: Diagnosis not present

## 2021-08-17 ENCOUNTER — Ambulatory Visit (HOSPITAL_COMMUNITY)
Admission: EM | Admit: 2021-08-17 | Discharge: 2021-08-17 | Disposition: A | Discharge status: Home / Self Care | Payer: Medicare Other | Attending: Psychiatry | Admitting: Psychiatry

## 2021-08-17 DIAGNOSIS — F339 Major depressive disorder, recurrent, unspecified: Secondary | ICD-10-CM | POA: Diagnosis not present

## 2021-08-17 DIAGNOSIS — F419 Anxiety disorder, unspecified: Secondary | ICD-10-CM | POA: Diagnosis not present

## 2021-08-17 DIAGNOSIS — E1165 Type 2 diabetes mellitus with hyperglycemia: Secondary | ICD-10-CM | POA: Diagnosis not present

## 2021-08-17 DIAGNOSIS — I10 Essential (primary) hypertension: Secondary | ICD-10-CM | POA: Diagnosis not present

## 2021-08-17 DIAGNOSIS — Z299 Encounter for prophylactic measures, unspecified: Secondary | ICD-10-CM | POA: Diagnosis not present

## 2021-08-17 DIAGNOSIS — F32A Depression, unspecified: Secondary | ICD-10-CM | POA: Insufficient documentation

## 2021-08-17 NOTE — Progress Notes (Signed)
   08/17/21 1439  [place] (Walk-ins at [facility] only)  How Did You Hear About [place]? Primary Care  What Is the Reason for Your Visit/Call Today? Pt is a 71 yo female who presented with her daughter voluntarily due to worsening depression related to stresses in her life and medical issues which have worsened recently. Pt denied SI, HI, NSSH, AVH, paranoia and all substance use. Pt denied any past IP psychiatric admissions. Pt was focusing on her ongoing GI issues and her recent hip surgery to repair a fracture from a recent fall. Pt stated she took a medication "years ago" that "changed her brain" and now she says she cannot "think right." She says she does not have current OP psych providers and is not prescribed any psych medications currently. Pt was referred to [facility] by her doctor who scrteened her for depression this morning and referred her for assessment.  How Long Has This Been Causing You Problems? > than 6 months  Have You Recently Had Any Thoughts About Hurting Yourself? No  Are You Planning to Commit Suicide/Harm Yourself At This time? No  Have you Recently Had Thoughts About [place]? No  Are You Planning To Harm Someone At This Time? No  Are you currently experiencing any auditory, visual or other hallucinations? No  Have You Used Any Alcohol or Drugs in the Past 24 Hours? No  Do you have any current medical co-morbidities that require immediate attention? No  Clinician description of patient physical appearance/behavior: Pt was calm, cooperative, alert and seemed fully oriented. Pt did not appear to be responding to internal stimuli, experiencing delusional thinking or to be intoxicated. Pt's speech and movement seemed normal. Pt's mood was dysphoric, and had a flat affect was congruent. Pt's judgment and insight seemed poor.  What Do You Feel Would Help You the Most Today? Treatment for Depression or other mood problem  If access to [facility] was not available,  would you have sought care in the Emergency Department? No  Determination of Need Routine (7 days)  Options For Referral Outpatient Therapy;Medication Management   Jermey Closs T. Mare Ferrari, [place], [facility], [facility] Triage Specialist [facility]

## 2021-08-17 NOTE — Discharge Instructions (Addendum)
Good afternoon!  It is imperative that you follow through with treatment recommendations within 7-10 days from the day of discharge to mitigate further risks to your safety and overall mental well-being.  There is a list of resources below for outpatient menta health service to get you started on finding the right provider for yourself.  In case of an urgent emergency, you have the option of contacting the Mobile Crisis Unit with [facility] at 1.8157926835.  You can also dial 211 or go to www.nc211.org for additional community resources outside of healthcare.  Outpatient Therapy and Psychiatry for Medicare Recipients  [place] [facility] [address], [place], [address] [phone] or [phone]  [address] [place], [place], [address] [phone] phone Call to verify if they take [facility]  [facility] [address] [place], [place], [address] [phone] phone [phone] fax  Maye Hides, [place] [place], [place], [address] [phone] phone   [place] [address] [place], [place], [address] [phone] phone  [facility] [address] [place], [place], [address] [phone] phone  [facility] [address] [place], [place], [address] [phone] phone  [facility] [address] [place], [place], [address] [phone] phone [phone] fax  [facility] [place] [address], [place], [address] [phone] phone  [facility] [address] [place], [place], [address] [phone] phone [phone] fax  [place] [address] [place], [place], [address] [phone] phone  [facility] [address] [place], [place], [address] [phone] phone  [facility] [place], [place], [address] [phone] phone [phone] fax

## 2021-08-17 NOTE — BH Assessment (Signed)
LCSW Progress Note   Per Aura Fey, NP, this pt does not require psychiatric hospitalization at this time.  Pt is psychiatrically cleared.  Discharge instructions include several resources for therapy and psychiatric services along with the number for mobile crisis and 211.  EDP Aura Fey, NP, has been notified.  Omelia Blackwater, MSW, [place] [phone] or [phone]

## 2021-08-17 NOTE — ED Provider Notes (Signed)
Behavioral Health Urgent Care Medical Screening Exam  Patient Name: Tina Reid MRN: [id number] Date of Evaluation: 08/17/21 Chief Complaint:   Diagnosis:  Final diagnoses:  Anxiety disorder, unspecified type   History of Present illness: Tina Reid is a 71 y.o. female. Pt presents voluntarily to [facility] behavioral health for walk-in assessment. Pt is accompanied by her daughter, Margarita Grizzle. Pt is assessed face-to-face by nurse practitioner.   Pt reports she is presenting to this facility today because "I don't feel right mentally". Reports chronic anxiety and depression. Reports currently feeling anxiety and depression. States this has been occurring for "quite a while". She reports anxiety and depression has been exacerbated by gastrointestinal sx of nausea for the past few years. Pt states she is followed by gastroenterology. 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 Reports caregiver strain. Pt reports sleeping 7 hours/night. Reports difficulty falling asleep due to anxious thoughts about "everything".  Pt denies SI/VI/HI, AVH, paranoia.  Pt denies hx of SA, inpatient psychiatric hospitalization.  Pt denies family psychiatric history.  Pt denies she is currently connected w/ medication management or counseling.  Pt denies alcohol, marijuana, crack/cocaine, other substance use.  Pt reports she is living w/ her husband.  Pt denies firearm in the home.  Pt reports previous medication trial of venlafaxine "years ago". She reports titration to '150mg'$ . She reports while on venlafaxine, experienced suicidal ideations.   Margarita Grizzle denies safety concerns w/ pt discharge today.   Discussed w/ pt recommendation for follow up with outpatient medication management and therapy. Pt agrees w/ plan. Pt denies safety concerns w/ discharge. Pt agrees she will follow up with outpatient gastroenterology for ongoing gastrointestinal sx.   Psychiatric  Specialty Exam  Presentation  General Appearance:Casual; Fairly Groomed  Eye Contact:Good  Speech:Clear and Coherent; Normal Rate  Speech Volume:Normal  Handedness:No data recorded  Mood and Affect  Mood:Anxious; Depressed  Affect:Blunt  Thought Process  Thought Processes:Coherent; Goal Directed; Linear  Descriptions of Associations:Intact  Orientation:Full (Time, Place and Person)  Thought Content:Logical    Hallucinations:None  Ideas of Reference:None  Suicidal Thoughts:No  Homicidal Thoughts:No   Sensorium  Memory:Immediate Good; Recent Good  Judgment:Fair  Insight:Fair   Executive Functions  Concentration:Fair  Attention Span:Fair  [place]   Psychomotor Activity  Psychomotor Activity:Normal   Assets  Assets:Communication Skills; Desire for Improvement   Sleep  Sleep:Fair  Number of hours: 7   No data recorded  Physical Exam: Physical Exam Constitutional:      Appearance: Normal appearance.  Cardiovascular:     Rate and Rhythm: Normal rate.  Pulmonary:     Effort: Pulmonary effort is normal.  Neurological:     Mental Status: She is alert and oriented to person, place, and time.  Psychiatric:        Attention and Perception: Attention and perception normal.        Mood and Affect: Mood is anxious and depressed. Affect is blunt.        Speech: Speech normal.        Behavior: Behavior normal. Behavior is cooperative.        Thought Content: Thought content normal.    Review of Systems  Constitutional:  Negative for chills and fever.  Respiratory:  Negative for shortness of breath.   Cardiovascular:  Negative for chest pain and palpitations.  Gastrointestinal:  Positive for nausea.  Psychiatric/Behavioral:  Positive for depression. The patient is nervous/anxious.  Blood pressure (!) 144/88, pulse 75, temperature 98.4 F (36.9 C), temperature source Oral, resp. rate 18, SpO2 92  %. There is no height or weight on file to calculate BMI.  Musculoskeletal: Strength & Muscle Tone: within normal limits Gait & Station: ambulates w/ walker Patient leans: N/A   [place] MSE Discharge Disposition for Follow up and Recommendations: Based on my evaluation the patient does not appear to have an emergency medical condition and can be discharged with resources and follow up care in outpatient services for Medication Management and Individual Therapy  Tharon Aquas, NP 08/17/2021, 6:02 PM

## 2021-08-19 DIAGNOSIS — M25551 Pain in right hip: Secondary | ICD-10-CM | POA: Diagnosis not present

## 2021-08-20 DIAGNOSIS — S72011D Unspecified intracapsular fracture of right femur, subsequent encounter for closed fracture with routine healing: Secondary | ICD-10-CM | POA: Diagnosis not present

## 2021-08-20 DIAGNOSIS — F419 Anxiety disorder, unspecified: Secondary | ICD-10-CM | POA: Diagnosis not present

## 2021-08-20 DIAGNOSIS — W19XXXD Unspecified fall, subsequent encounter: Secondary | ICD-10-CM | POA: Diagnosis not present

## 2021-08-20 DIAGNOSIS — I10 Essential (primary) hypertension: Secondary | ICD-10-CM | POA: Diagnosis not present

## 2021-08-20 DIAGNOSIS — M7061 Trochanteric bursitis, right hip: Secondary | ICD-10-CM | POA: Diagnosis not present

## 2021-08-20 DIAGNOSIS — F32A Depression, unspecified: Secondary | ICD-10-CM | POA: Diagnosis not present

## 2021-08-25 DIAGNOSIS — F331 Major depressive disorder, recurrent, moderate: Secondary | ICD-10-CM | POA: Diagnosis not present

## 2021-08-26 DIAGNOSIS — R262 Difficulty in walking, not elsewhere classified: Secondary | ICD-10-CM | POA: Diagnosis not present

## 2021-08-26 DIAGNOSIS — M25551 Pain in right hip: Secondary | ICD-10-CM | POA: Diagnosis not present

## 2021-08-26 DIAGNOSIS — R2681 Unsteadiness on feet: Secondary | ICD-10-CM | POA: Diagnosis not present

## 2021-09-01 DIAGNOSIS — Z79899 Other long term (current) drug therapy: Secondary | ICD-10-CM | POA: Diagnosis not present

## 2021-09-01 DIAGNOSIS — F32A Depression, unspecified: Secondary | ICD-10-CM | POA: Diagnosis not present

## 2021-09-01 DIAGNOSIS — R451 Restlessness and agitation: Secondary | ICD-10-CM | POA: Diagnosis not present

## 2021-09-02 DIAGNOSIS — F411 Generalized anxiety disorder: Secondary | ICD-10-CM | POA: Diagnosis not present

## 2021-09-06 DIAGNOSIS — F339 Major depressive disorder, recurrent, unspecified: Secondary | ICD-10-CM | POA: Diagnosis not present

## 2021-09-06 DIAGNOSIS — M25551 Pain in right hip: Secondary | ICD-10-CM | POA: Diagnosis not present

## 2021-09-06 DIAGNOSIS — R42 Dizziness and giddiness: Secondary | ICD-10-CM | POA: Diagnosis not present

## 2021-09-06 DIAGNOSIS — Z299 Encounter for prophylactic measures, unspecified: Secondary | ICD-10-CM | POA: Diagnosis not present

## 2021-09-06 DIAGNOSIS — R262 Difficulty in walking, not elsewhere classified: Secondary | ICD-10-CM | POA: Diagnosis not present

## 2021-09-06 DIAGNOSIS — R410 Disorientation, unspecified: Secondary | ICD-10-CM | POA: Diagnosis not present

## 2021-09-06 DIAGNOSIS — R2681 Unsteadiness on feet: Secondary | ICD-10-CM | POA: Diagnosis not present

## 2021-09-06 DIAGNOSIS — I10 Essential (primary) hypertension: Secondary | ICD-10-CM | POA: Diagnosis not present

## 2021-09-07 DIAGNOSIS — R42 Dizziness and giddiness: Secondary | ICD-10-CM | POA: Diagnosis not present

## 2021-09-08 DIAGNOSIS — R2681 Unsteadiness on feet: Secondary | ICD-10-CM | POA: Diagnosis not present

## 2021-09-08 DIAGNOSIS — R262 Difficulty in walking, not elsewhere classified: Secondary | ICD-10-CM | POA: Diagnosis not present

## 2021-09-08 DIAGNOSIS — M25551 Pain in right hip: Secondary | ICD-10-CM | POA: Diagnosis not present

## 2021-09-13 DIAGNOSIS — R2681 Unsteadiness on feet: Secondary | ICD-10-CM | POA: Diagnosis not present

## 2021-09-13 DIAGNOSIS — R262 Difficulty in walking, not elsewhere classified: Secondary | ICD-10-CM | POA: Diagnosis not present

## 2021-09-13 DIAGNOSIS — M25551 Pain in right hip: Secondary | ICD-10-CM | POA: Diagnosis not present

## 2021-09-16 DIAGNOSIS — F339 Major depressive disorder, recurrent, unspecified: Secondary | ICD-10-CM | POA: Diagnosis not present

## 2021-09-16 DIAGNOSIS — Z299 Encounter for prophylactic measures, unspecified: Secondary | ICD-10-CM | POA: Diagnosis not present

## 2021-09-16 DIAGNOSIS — E1165 Type 2 diabetes mellitus with hyperglycemia: Secondary | ICD-10-CM | POA: Diagnosis not present

## 2021-09-16 DIAGNOSIS — I10 Essential (primary) hypertension: Secondary | ICD-10-CM | POA: Diagnosis not present

## 2021-09-16 DIAGNOSIS — I739 Peripheral vascular disease, unspecified: Secondary | ICD-10-CM | POA: Diagnosis not present

## 2021-09-23 DIAGNOSIS — Z09 Encounter for follow-up examination after completed treatment for conditions other than malignant neoplasm: Secondary | ICD-10-CM | POA: Diagnosis not present

## 2021-09-23 DIAGNOSIS — M25551 Pain in right hip: Secondary | ICD-10-CM | POA: Diagnosis not present

## 2021-09-23 DIAGNOSIS — S72011S Unspecified intracapsular fracture of right femur, sequela: Secondary | ICD-10-CM | POA: Diagnosis not present

## 2021-09-27 ENCOUNTER — Emergency Department (HOSPITAL_COMMUNITY)
Admission: EM | Admit: 2021-09-27 | Discharge: 2021-09-27 | Disposition: A | Discharge status: Home / Self Care | Payer: Medicare Other | Attending: Emergency Medicine | Admitting: Emergency Medicine

## 2021-09-27 ENCOUNTER — Encounter (HOSPITAL_COMMUNITY): Payer: Self-pay

## 2021-09-27 DIAGNOSIS — I10 Essential (primary) hypertension: Secondary | ICD-10-CM | POA: Insufficient documentation

## 2021-09-27 DIAGNOSIS — E119 Type 2 diabetes mellitus without complications: Secondary | ICD-10-CM | POA: Insufficient documentation

## 2021-09-27 DIAGNOSIS — F32A Depression, unspecified: Secondary | ICD-10-CM | POA: Diagnosis not present

## 2021-09-27 DIAGNOSIS — Z7984 Long term (current) use of oral hypoglycemic drugs: Secondary | ICD-10-CM | POA: Insufficient documentation

## 2021-09-27 DIAGNOSIS — Z79899 Other long term (current) drug therapy: Secondary | ICD-10-CM | POA: Diagnosis not present

## 2021-09-27 DIAGNOSIS — R630 Anorexia: Secondary | ICD-10-CM | POA: Diagnosis not present

## 2021-09-27 DIAGNOSIS — F419 Anxiety disorder, unspecified: Secondary | ICD-10-CM | POA: Diagnosis not present

## 2021-09-27 LAB — COMPREHENSIVE METABOLIC PANEL
ALT: 14 U/L (ref 0–44)
AST: 15 U/L (ref 15–41)
Albumin: 3.9 g/dL (ref 3.5–5.0)
Alkaline Phosphatase: 46 U/L (ref 38–126)
Anion gap: 7 (ref 5–15)
BUN: 11 mg/dL (ref 8–23)
CO2: 29 mmol/L (ref 22–32)
Calcium: 9.2 mg/dL (ref 8.9–10.3)
Chloride: 102 mmol/L (ref 98–111)
Creatinine, Ser: 0.59 mg/dL (ref 0.44–1.00)
GFR, Estimated: >60 mL/min (ref >60)
Glucose, Bld: 112 mg/dL — ABNORMAL HIGH (ref 70–99)
Potassium: 4 mmol/L (ref 3.5–5.1)
Sodium: 138 mmol/L (ref 135–145)
Total Bilirubin: 0.8 mg/dL (ref 0.3–1.2)
Total Protein: 6.8 g/dL (ref 6.5–8.1)

## 2021-09-27 LAB — CBC
HCT: 48 % — ABNORMAL HIGH (ref 36.0–46.0)
Hemoglobin: 15.5 g/dL — ABNORMAL HIGH (ref 12.0–15.0)
MCH: 29.9 pg (ref 26.0–34.0)
MCHC: 32.3 g/dL (ref 30.0–36.0)
MCV: 92.5 fL (ref 80.0–100.0)
Platelets: 308 10*3/uL (ref 150–400)
RBC: 5.19 MIL/uL — ABNORMAL HIGH (ref 3.87–5.11)
RDW: 12.4 % (ref 11.5–15.5)
WBC: 6.7 10*3/uL (ref 4.0–10.5)
nRBC: 0 % (ref 0.0–0.2)

## 2021-09-27 LAB — SALICYLATE LEVEL: Salicylate Lvl: <7 mg/dL — ABNORMAL LOW (ref 7.0–30.0)

## 2021-09-27 LAB — ETHANOL: Alcohol, Ethyl (B): <10 mg/dL (ref <10)

## 2021-09-27 LAB — ACETAMINOPHEN LEVEL: Acetaminophen (Tylenol), Serum: <10 ug/mL — ABNORMAL LOW (ref 10–30)

## 2021-09-27 NOTE — Discharge Instructions (Addendum)
You came to the emergency department today to be evaluated for your depression.  Your physical exam and lab results were reassuring.  Please continue to take your Celexa medication as prescribed.  Please follow-up with a psychiatrist and counselor in the outpatient setting.  I have given you resources for outpatient counseling.  Additionally may go to the [facility] as needed for behavioral health emergencies.  Please follow-up with your primary care doctor for further management of your Celexa.  Get help right away if: You have thoughts of hurting yourself or others.

## 2021-09-27 NOTE — ED Provider Triage Note (Signed)
Emergency Medicine Provider Triage Evaluation Note  Tina Reid , a 71 y.o. female  was evaluated in triage.  Pt complains of "feeling sick and depressed."  States she has been suffering with bouts of depression for nearly 1 year.  She has been unable to perform her daily activities, states that she stays in bed all day and feels so sick and weak that she cannot get out of bed.  She endorses having constant thoughts of "why am I feeling this way, why cannot you get up and do something."  States she has been to mental health counselor and went worth without improvement.  She was recently started on Celexa by her PCP.  She has been taking for approximately 2 weeks but does not notice any improvement.  She denies any suicidal, homicidal thoughts or plans.  She denies any hallucinations.  Denies any illicit drug use.  States she called [facility] today and was advised to come in for evaluation.  Review of Systems  Positive: Depressed, generalized fatigue Negative: Suicidal homicidal thoughts or plan, no auditory or visual hallucinations  Physical Exam  BP 119/80 (BP Location: Right Arm)   Pulse 73   Temp 97.9 F (36.6 C) (Oral)   Resp 20   Ht '4\' 11"'$  (1.499 m)   Wt 63.2 kg   SpO2 98%   BMI 28.14 kg/m  Gen:   Awake, no distress   Resp:  Normal effort  MSK:   Moves extremities without difficulty  Other:  No peripheral edema  Medical Decision Making  Medically screening exam initiated at 4:30 PM.  Appropriate orders placed.  PAXTYN WISDOM was informed that the remainder of the evaluation will be completed by another provider, this initial triage assessment does not replace that evaluation, and the importance of remaining in the ED until their evaluation is complete.  Patient here for evaluation of feelings of depression times nearly 1 year.  Denies any SI or HI, no hallucinations.  She will need further evaluation in the emergency department, she is agreeable to plan.    Kem Parkinson, PA-C 09/27/21 1658

## 2021-09-27 NOTE — ED Provider Notes (Signed)
[facility] EMERGENCY DEPARTMENT Provider Note   CSN: [id number] Arrival date & time: 09/27/21  1436     History  Chief Complaint  Patient presents with   Depression    Tina Reid is a 71 y.o. female with a history of diabetes mellitus, hypertension, Bell's palsy, IBS, depression, anxiety.  Presents emerged department complaint of depression.  Patient reports that she is feeling depressed and anxious for "a long time close to a year."  Patient states that she "feels sick physically mentally."  Patient reports that she is having decreased appetite and has been staying in bed for prolonged periods of time due to her increased depression.  Patient was started on Celexa by her primary care doctor.  Patient was on week 3 of this medication.  Patient does not have a psychiatrist or counselor in the outpatient setting.  Patient denies any suicidal ideations.  Patient states "I have too much to live for."  Patient denies any homicidal ideations, auditory hallucinations, visual hallucinations, illicit drug use, or illicit alcohol use.   Depression Pertinent negatives include no chest pain, no abdominal pain, no headaches and no shortness of breath.       Home Medications Prior to Admission medications   Medication Sig Start Date End Date Taking? Authorizing Provider  ALPRAZolam Duanne Moron) 0.5 MG tablet Take 0.5 mg by mouth 2 (two) times daily. 12/06/20   [provider]  cetirizine (ZYRTEC) 10 MG tablet Take 10 mg by mouth daily.    [provider]  glipiZIDE (GLUCOTROL XL) 2.5 MG 24 hr tablet Take 2.5 mg by mouth daily. 01/04/21   [provider]  pantoprazole (PROTONIX) 40 MG tablet Take 40 mg by mouth daily. 03/03/21   [provider]      Allergies    Codeine    Review of Systems   Review of Systems  Constitutional:  Negative for chills and fever.  Eyes:  Negative for visual disturbance.  Respiratory:  Negative for shortness of breath.    Cardiovascular:  Negative for chest pain.  Gastrointestinal:  Negative for abdominal pain, nausea and vomiting.  Musculoskeletal:  Negative for back pain and neck pain.  Skin:  Negative for color change and rash.  Neurological:  Negative for dizziness, syncope, light-headedness and headaches.  Psychiatric/Behavioral:  Positive for depression. Negative for confusion, hallucinations and suicidal ideas.     Physical Exam Updated Vital Signs BP 119/80 (BP Location: Right Arm)   Pulse 73   Temp 97.9 F (36.6 C) (Oral)   Resp 20   Ht '4\' 11"'$  (1.499 m)   Wt 63.2 kg   SpO2 98%   BMI 28.14 kg/m  Physical Exam Vitals and nursing note reviewed.  Constitutional:      General: She is not in acute distress.    Appearance: She is not ill-appearing, toxic-appearing or diaphoretic.  HENT:     Head: Normocephalic.  Eyes:     General: No scleral icterus.       Right eye: No discharge.        Left eye: No discharge.  Cardiovascular:     Rate and Rhythm: Normal rate.  Pulmonary:     Effort: Pulmonary effort is normal.  Skin:    General: Skin is warm and dry.  Neurological:     General: No focal deficit present.     Mental Status: She is alert and oriented to person, place, and time.     GCS: GCS eye subscore is 4. GCS  verbal subscore is 5. GCS motor subscore is 6.  Psychiatric:        Attention and Perception: She does not perceive auditory or visual hallucinations.        Mood and Affect: Mood is depressed.        Behavior: Behavior is cooperative.        Thought Content: Thought content is not paranoid or delusional. Thought content does not include homicidal or suicidal ideation. Thought content does not include homicidal or suicidal plan.     ED Results / Procedures / Treatments   Labs (all labs ordered are listed, but only abnormal results are displayed) Labs Reviewed  COMPREHENSIVE METABOLIC PANEL - Abnormal; Notable for the following components:      Result Value   Glucose,  Bld 112 (*)    All other components within normal limits  SALICYLATE LEVEL - Abnormal; Notable for the following components:   Salicylate Lvl <0.8 (*)    All other components within normal limits  ACETAMINOPHEN LEVEL - Abnormal; Notable for the following components:   Acetaminophen (Tylenol), Serum <10 (*)    All other components within normal limits  CBC - Abnormal; Notable for the following components:   RBC 5.19 (*)    Hemoglobin 15.5 (*)    HCT 48.0 (*)    All other components within normal limits  ETHANOL  RAPID URINE DRUG SCREEN, HOSP PERFORMED    EKG None  Radiology No results found.  Procedures Procedures    Medications Ordered in ED Medications - No data to display  ED Course/ Medical Decision Making/ A&P                           Medical Decision Making Amount and/or Complexity of Data Reviewed Labs: ordered.   Alert 71 year old female in no acute distress, nontoxic-appearing.  Presents emergency department complaint of depression.  Information obtained from patient.  I reviewed patient's past medical records including previous prior notes, labs, and imaging.  Patient has medical history as outlined in HPI which complicates her care.  Per chart review patient was seen at behavioral urgent care last month for similar complaints.  Patient was given information to follow-up with outpatient services for medication management and individual therapy.  Patient was also [facility] at [facility] department last month as well patient was seen by behavioral health at that time as well and recommended outpatient follow-up.  Patient is currently on Celexa and has been for last 3 weeks.  Per chart review patient had TSH within normal limits February 2023.  Medical clearance labs were ordered while patient was in triage.  I personally viewed and interpreted patient's lab results.  Pertinent findings include: -Acetaminophen, salicylate, and ethanol levels all  within normal limits -Hemoconcentration noted on CBC -CMP unremarkable  Patient has no SI, HI, auditory loose Nations, or visual hallucinations.  Patient is not acutely psychotic at this time.  No criteria for IVC or TTS consult.  Patient advised to follow-up with her primary care doctor for continued management of her Celexa.  Patient also advised to follow-up with psychiatrist and counseling in the outpatient setting.  Patient will be given resources for counseling in the outpatient setting.  Based on patient's chief complaint, I considered admission might be necessary, however after reassuring ED workup feel patient is reasonable for discharge.  Discussed results, findings, treatment and follow up. Patient advised of return precautions. Patient verbalized understanding and agreed  with plan.  Portions of this note were generated with [employer]. Dictation errors may occur despite best attempts at proofreading.         Final Clinical Impression(s) / ED Diagnoses Final diagnoses:  None    Rx / DC Orders ED Discharge Orders     None         Dyann Ruddle 09/27/21 1754    Godfrey Pick, MD 09/28/21 [phone]

## 2021-09-27 NOTE — ED Triage Notes (Signed)
Pt states she has been depressed, laying in bed several days without motivation to get up. Pt states she has been having severe bouts of depression x 1 year. Denies Hi/SI. Pt saw therapist at [facility] and didn't get any help.

## 2021-09-30 DIAGNOSIS — F339 Major depressive disorder, recurrent, unspecified: Secondary | ICD-10-CM | POA: Diagnosis not present

## 2021-09-30 DIAGNOSIS — I10 Essential (primary) hypertension: Secondary | ICD-10-CM | POA: Diagnosis not present

## 2021-09-30 DIAGNOSIS — Z299 Encounter for prophylactic measures, unspecified: Secondary | ICD-10-CM | POA: Diagnosis not present

## 2021-09-30 DIAGNOSIS — E1165 Type 2 diabetes mellitus with hyperglycemia: Secondary | ICD-10-CM | POA: Diagnosis not present

## 2021-10-20 DIAGNOSIS — Z299 Encounter for prophylactic measures, unspecified: Secondary | ICD-10-CM | POA: Diagnosis not present

## 2021-10-20 DIAGNOSIS — F339 Major depressive disorder, recurrent, unspecified: Secondary | ICD-10-CM | POA: Diagnosis not present

## 2021-10-20 DIAGNOSIS — I10 Essential (primary) hypertension: Secondary | ICD-10-CM | POA: Diagnosis not present

## 2021-11-15 DIAGNOSIS — M7061 Trochanteric bursitis, right hip: Secondary | ICD-10-CM | POA: Diagnosis not present

## 2021-11-15 DIAGNOSIS — M48061 Spinal stenosis, lumbar region without neurogenic claudication: Secondary | ICD-10-CM | POA: Diagnosis not present

## 2021-11-15 DIAGNOSIS — M7062 Trochanteric bursitis, left hip: Secondary | ICD-10-CM | POA: Diagnosis not present

## 2021-11-16 ENCOUNTER — Telehealth (HOSPITAL_COMMUNITY): Payer: Self-pay

## 2021-11-16 NOTE — Telephone Encounter (Signed)
Called pt to schedule appt she states that she is seeing someone closer to her now

## 2021-11-23 DIAGNOSIS — E1165 Type 2 diabetes mellitus with hyperglycemia: Secondary | ICD-10-CM | POA: Diagnosis not present

## 2021-11-23 DIAGNOSIS — Z1339 Encounter for screening examination for other mental health and behavioral disorders: Secondary | ICD-10-CM | POA: Diagnosis not present

## 2021-11-23 DIAGNOSIS — F339 Major depressive disorder, recurrent, unspecified: Secondary | ICD-10-CM | POA: Diagnosis not present

## 2021-11-23 DIAGNOSIS — Z299 Encounter for prophylactic measures, unspecified: Secondary | ICD-10-CM | POA: Diagnosis not present

## 2021-11-23 DIAGNOSIS — R5383 Other fatigue: Secondary | ICD-10-CM | POA: Diagnosis not present

## 2021-11-23 DIAGNOSIS — Z79899 Other long term (current) drug therapy: Secondary | ICD-10-CM | POA: Diagnosis not present

## 2021-11-23 DIAGNOSIS — Z0000 Encounter for general adult medical examination without abnormal findings: Secondary | ICD-10-CM | POA: Diagnosis not present

## 2021-11-23 DIAGNOSIS — Z1331 Encounter for screening for depression: Secondary | ICD-10-CM | POA: Diagnosis not present

## 2021-11-23 DIAGNOSIS — I10 Essential (primary) hypertension: Secondary | ICD-10-CM | POA: Diagnosis not present

## 2021-11-23 DIAGNOSIS — Z7189 Other specified counseling: Secondary | ICD-10-CM | POA: Diagnosis not present

## 2021-11-23 DIAGNOSIS — E7800 Pure hypercholesterolemia, unspecified: Secondary | ICD-10-CM | POA: Diagnosis not present

## 2021-11-23 DIAGNOSIS — Z6825 Body mass index (BMI) 25.0-25.9, adult: Secondary | ICD-10-CM | POA: Diagnosis not present

## 2021-12-21 DIAGNOSIS — F339 Major depressive disorder, recurrent, unspecified: Secondary | ICD-10-CM | POA: Diagnosis not present

## 2022-01-25 DIAGNOSIS — F339 Major depressive disorder, recurrent, unspecified: Secondary | ICD-10-CM | POA: Diagnosis not present

## 2022-02-11 DIAGNOSIS — M816 Localized osteoporosis [Lequesne]: Secondary | ICD-10-CM | POA: Diagnosis not present

## 2022-02-11 DIAGNOSIS — M8580 Other specified disorders of bone density and structure, unspecified site: Secondary | ICD-10-CM | POA: Diagnosis not present

## 2022-02-15 DIAGNOSIS — F339 Major depressive disorder, recurrent, unspecified: Secondary | ICD-10-CM | POA: Diagnosis not present

## 2022-02-16 DIAGNOSIS — M816 Localized osteoporosis [Lequesne]: Secondary | ICD-10-CM | POA: Diagnosis not present

## 2022-02-16 DIAGNOSIS — M8580 Other specified disorders of bone density and structure, unspecified site: Secondary | ICD-10-CM | POA: Diagnosis not present

## 2022-02-16 DIAGNOSIS — M810 Age-related osteoporosis without current pathological fracture: Secondary | ICD-10-CM | POA: Diagnosis not present

## 2022-03-11 DIAGNOSIS — M816 Localized osteoporosis [Lequesne]: Secondary | ICD-10-CM | POA: Diagnosis not present

## 2022-03-11 DIAGNOSIS — M8580 Other specified disorders of bone density and structure, unspecified site: Secondary | ICD-10-CM | POA: Diagnosis not present

## 2022-03-11 DIAGNOSIS — M810 Age-related osteoporosis without current pathological fracture: Secondary | ICD-10-CM | POA: Diagnosis not present

## 2022-03-15 DIAGNOSIS — F419 Anxiety disorder, unspecified: Secondary | ICD-10-CM | POA: Diagnosis not present

## 2022-03-15 DIAGNOSIS — F329 Major depressive disorder, single episode, unspecified: Secondary | ICD-10-CM | POA: Diagnosis not present

## 2022-03-15 DIAGNOSIS — Z79899 Other long term (current) drug therapy: Secondary | ICD-10-CM | POA: Diagnosis not present

## 2022-03-28 DIAGNOSIS — M810 Age-related osteoporosis without current pathological fracture: Secondary | ICD-10-CM | POA: Diagnosis not present

## 2022-03-28 DIAGNOSIS — M48061 Spinal stenosis, lumbar region without neurogenic claudication: Secondary | ICD-10-CM | POA: Diagnosis not present

## 2022-03-28 DIAGNOSIS — M25551 Pain in right hip: Secondary | ICD-10-CM | POA: Diagnosis not present

## 2022-03-28 DIAGNOSIS — M25561 Pain in right knee: Secondary | ICD-10-CM | POA: Diagnosis not present

## 2022-03-28 DIAGNOSIS — M7061 Trochanteric bursitis, right hip: Secondary | ICD-10-CM | POA: Diagnosis not present

## 2022-03-28 DIAGNOSIS — M549 Dorsalgia, unspecified: Secondary | ICD-10-CM | POA: Diagnosis not present

## 2022-04-04 DIAGNOSIS — E1165 Type 2 diabetes mellitus with hyperglycemia: Secondary | ICD-10-CM | POA: Diagnosis not present

## 2022-04-04 DIAGNOSIS — Z299 Encounter for prophylactic measures, unspecified: Secondary | ICD-10-CM | POA: Diagnosis not present

## 2022-04-04 DIAGNOSIS — I10 Essential (primary) hypertension: Secondary | ICD-10-CM | POA: Diagnosis not present

## 2022-04-07 DIAGNOSIS — H35033 Hypertensive retinopathy, bilateral: Secondary | ICD-10-CM | POA: Diagnosis not present

## 2022-04-11 DIAGNOSIS — M79651 Pain in right thigh: Secondary | ICD-10-CM | POA: Diagnosis not present

## 2022-04-11 DIAGNOSIS — M545 Low back pain, unspecified: Secondary | ICD-10-CM | POA: Diagnosis not present

## 2022-04-11 DIAGNOSIS — Z9181 History of falling: Secondary | ICD-10-CM | POA: Diagnosis not present

## 2022-04-11 DIAGNOSIS — M25651 Stiffness of right hip, not elsewhere classified: Secondary | ICD-10-CM | POA: Diagnosis not present

## 2022-04-11 DIAGNOSIS — M25551 Pain in right hip: Secondary | ICD-10-CM | POA: Diagnosis not present

## 2022-04-11 DIAGNOSIS — M810 Age-related osteoporosis without current pathological fracture: Secondary | ICD-10-CM | POA: Diagnosis not present

## 2022-04-11 DIAGNOSIS — R2681 Unsteadiness on feet: Secondary | ICD-10-CM | POA: Diagnosis not present

## 2022-04-11 DIAGNOSIS — R2689 Other abnormalities of gait and mobility: Secondary | ICD-10-CM | POA: Diagnosis not present

## 2022-04-11 DIAGNOSIS — M25561 Pain in right knee: Secondary | ICD-10-CM | POA: Diagnosis not present

## 2022-04-11 DIAGNOSIS — R29898 Other symptoms and signs involving the musculoskeletal system: Secondary | ICD-10-CM | POA: Diagnosis not present

## 2022-04-12 DIAGNOSIS — F339 Major depressive disorder, recurrent, unspecified: Secondary | ICD-10-CM | POA: Diagnosis not present

## 2022-04-14 DIAGNOSIS — M25561 Pain in right knee: Secondary | ICD-10-CM | POA: Diagnosis not present

## 2022-04-14 DIAGNOSIS — R2681 Unsteadiness on feet: Secondary | ICD-10-CM | POA: Diagnosis not present

## 2022-04-14 DIAGNOSIS — Z9181 History of falling: Secondary | ICD-10-CM | POA: Diagnosis not present

## 2022-04-14 DIAGNOSIS — M25551 Pain in right hip: Secondary | ICD-10-CM | POA: Diagnosis not present

## 2022-04-14 DIAGNOSIS — M545 Low back pain, unspecified: Secondary | ICD-10-CM | POA: Diagnosis not present

## 2022-04-14 DIAGNOSIS — M810 Age-related osteoporosis without current pathological fracture: Secondary | ICD-10-CM | POA: Diagnosis not present

## 2022-04-18 DIAGNOSIS — R2681 Unsteadiness on feet: Secondary | ICD-10-CM | POA: Diagnosis not present

## 2022-04-18 DIAGNOSIS — Z9181 History of falling: Secondary | ICD-10-CM | POA: Diagnosis not present

## 2022-04-18 DIAGNOSIS — M545 Low back pain, unspecified: Secondary | ICD-10-CM | POA: Diagnosis not present

## 2022-04-18 DIAGNOSIS — M25551 Pain in right hip: Secondary | ICD-10-CM | POA: Diagnosis not present

## 2022-04-18 DIAGNOSIS — M810 Age-related osteoporosis without current pathological fracture: Secondary | ICD-10-CM | POA: Diagnosis not present

## 2022-04-18 DIAGNOSIS — M25561 Pain in right knee: Secondary | ICD-10-CM | POA: Diagnosis not present

## 2022-04-21 DIAGNOSIS — Z9181 History of falling: Secondary | ICD-10-CM | POA: Diagnosis not present

## 2022-04-21 DIAGNOSIS — M25551 Pain in right hip: Secondary | ICD-10-CM | POA: Diagnosis not present

## 2022-04-21 DIAGNOSIS — M810 Age-related osteoporosis without current pathological fracture: Secondary | ICD-10-CM | POA: Diagnosis not present

## 2022-04-21 DIAGNOSIS — M25561 Pain in right knee: Secondary | ICD-10-CM | POA: Diagnosis not present

## 2022-04-21 DIAGNOSIS — M545 Low back pain, unspecified: Secondary | ICD-10-CM | POA: Diagnosis not present

## 2022-04-21 DIAGNOSIS — R2681 Unsteadiness on feet: Secondary | ICD-10-CM | POA: Diagnosis not present

## 2022-04-28 DIAGNOSIS — M25551 Pain in right hip: Secondary | ICD-10-CM | POA: Diagnosis not present

## 2022-04-28 DIAGNOSIS — M545 Low back pain, unspecified: Secondary | ICD-10-CM | POA: Diagnosis not present

## 2022-04-28 DIAGNOSIS — R2681 Unsteadiness on feet: Secondary | ICD-10-CM | POA: Diagnosis not present

## 2022-04-28 DIAGNOSIS — M25561 Pain in right knee: Secondary | ICD-10-CM | POA: Diagnosis not present

## 2022-04-28 DIAGNOSIS — Z9181 History of falling: Secondary | ICD-10-CM | POA: Diagnosis not present

## 2022-04-28 DIAGNOSIS — M810 Age-related osteoporosis without current pathological fracture: Secondary | ICD-10-CM | POA: Diagnosis not present

## 2022-05-02 DIAGNOSIS — M810 Age-related osteoporosis without current pathological fracture: Secondary | ICD-10-CM | POA: Diagnosis not present

## 2022-05-02 DIAGNOSIS — Z9181 History of falling: Secondary | ICD-10-CM | POA: Diagnosis not present

## 2022-05-02 DIAGNOSIS — R2681 Unsteadiness on feet: Secondary | ICD-10-CM | POA: Diagnosis not present

## 2022-05-02 DIAGNOSIS — M25551 Pain in right hip: Secondary | ICD-10-CM | POA: Diagnosis not present

## 2022-05-02 DIAGNOSIS — M25561 Pain in right knee: Secondary | ICD-10-CM | POA: Diagnosis not present

## 2022-05-02 DIAGNOSIS — M545 Low back pain, unspecified: Secondary | ICD-10-CM | POA: Diagnosis not present

## 2022-05-04 DIAGNOSIS — M545 Low back pain, unspecified: Secondary | ICD-10-CM | POA: Diagnosis not present

## 2022-05-04 DIAGNOSIS — F419 Anxiety disorder, unspecified: Secondary | ICD-10-CM | POA: Diagnosis not present

## 2022-05-05 DIAGNOSIS — M25551 Pain in right hip: Secondary | ICD-10-CM | POA: Diagnosis not present

## 2022-05-05 DIAGNOSIS — Z9181 History of falling: Secondary | ICD-10-CM | POA: Diagnosis not present

## 2022-05-05 DIAGNOSIS — R2681 Unsteadiness on feet: Secondary | ICD-10-CM | POA: Diagnosis not present

## 2022-05-05 DIAGNOSIS — M810 Age-related osteoporosis without current pathological fracture: Secondary | ICD-10-CM | POA: Diagnosis not present

## 2022-05-05 DIAGNOSIS — M25561 Pain in right knee: Secondary | ICD-10-CM | POA: Diagnosis not present

## 2022-05-05 DIAGNOSIS — M545 Low back pain, unspecified: Secondary | ICD-10-CM | POA: Diagnosis not present

## 2022-05-09 DIAGNOSIS — M545 Low back pain, unspecified: Secondary | ICD-10-CM | POA: Diagnosis not present

## 2022-05-09 DIAGNOSIS — M810 Age-related osteoporosis without current pathological fracture: Secondary | ICD-10-CM | POA: Diagnosis not present

## 2022-05-09 DIAGNOSIS — R2681 Unsteadiness on feet: Secondary | ICD-10-CM | POA: Diagnosis not present

## 2022-05-09 DIAGNOSIS — M25551 Pain in right hip: Secondary | ICD-10-CM | POA: Diagnosis not present

## 2022-05-09 DIAGNOSIS — M25561 Pain in right knee: Secondary | ICD-10-CM | POA: Diagnosis not present

## 2022-05-09 DIAGNOSIS — Z9181 History of falling: Secondary | ICD-10-CM | POA: Diagnosis not present

## 2022-05-10 DIAGNOSIS — Z79899 Other long term (current) drug therapy: Secondary | ICD-10-CM | POA: Diagnosis not present

## 2022-05-10 DIAGNOSIS — E119 Type 2 diabetes mellitus without complications: Secondary | ICD-10-CM | POA: Diagnosis not present

## 2022-05-10 DIAGNOSIS — F419 Anxiety disorder, unspecified: Secondary | ICD-10-CM | POA: Diagnosis not present

## 2022-05-10 DIAGNOSIS — F329 Major depressive disorder, single episode, unspecified: Secondary | ICD-10-CM | POA: Diagnosis not present

## 2022-05-17 DIAGNOSIS — R2689 Other abnormalities of gait and mobility: Secondary | ICD-10-CM | POA: Diagnosis not present

## 2022-05-17 DIAGNOSIS — M25651 Stiffness of right hip, not elsewhere classified: Secondary | ICD-10-CM | POA: Diagnosis not present

## 2022-05-17 DIAGNOSIS — M545 Low back pain, unspecified: Secondary | ICD-10-CM | POA: Diagnosis not present

## 2022-05-17 DIAGNOSIS — R29898 Other symptoms and signs involving the musculoskeletal system: Secondary | ICD-10-CM | POA: Diagnosis not present

## 2022-05-17 DIAGNOSIS — M25561 Pain in right knee: Secondary | ICD-10-CM | POA: Diagnosis not present

## 2022-05-17 DIAGNOSIS — M79651 Pain in right thigh: Secondary | ICD-10-CM | POA: Diagnosis not present

## 2022-05-17 DIAGNOSIS — M810 Age-related osteoporosis without current pathological fracture: Secondary | ICD-10-CM | POA: Diagnosis not present

## 2022-05-17 DIAGNOSIS — R2681 Unsteadiness on feet: Secondary | ICD-10-CM | POA: Diagnosis not present

## 2022-05-17 DIAGNOSIS — M25551 Pain in right hip: Secondary | ICD-10-CM | POA: Diagnosis not present

## 2022-05-17 DIAGNOSIS — Z9181 History of falling: Secondary | ICD-10-CM | POA: Diagnosis not present

## 2022-07-13 DIAGNOSIS — F329 Major depressive disorder, single episode, unspecified: Secondary | ICD-10-CM | POA: Diagnosis not present

## 2022-07-13 DIAGNOSIS — F419 Anxiety disorder, unspecified: Secondary | ICD-10-CM | POA: Diagnosis not present

## 2022-08-01 DIAGNOSIS — I152 Hypertension secondary to endocrine disorders: Secondary | ICD-10-CM | POA: Diagnosis not present

## 2022-08-01 DIAGNOSIS — M545 Low back pain, unspecified: Secondary | ICD-10-CM | POA: Diagnosis not present

## 2022-08-01 DIAGNOSIS — I739 Peripheral vascular disease, unspecified: Secondary | ICD-10-CM | POA: Diagnosis not present

## 2022-08-01 DIAGNOSIS — I10 Essential (primary) hypertension: Secondary | ICD-10-CM | POA: Diagnosis not present

## 2022-08-01 DIAGNOSIS — E1159 Type 2 diabetes mellitus with other circulatory complications: Secondary | ICD-10-CM | POA: Diagnosis not present

## 2022-08-01 DIAGNOSIS — Z299 Encounter for prophylactic measures, unspecified: Secondary | ICD-10-CM | POA: Diagnosis not present

## 2022-08-02 DIAGNOSIS — F339 Major depressive disorder, recurrent, unspecified: Secondary | ICD-10-CM | POA: Diagnosis not present

## 2022-08-17 DIAGNOSIS — F329 Major depressive disorder, single episode, unspecified: Secondary | ICD-10-CM | POA: Diagnosis not present

## 2022-08-17 DIAGNOSIS — F419 Anxiety disorder, unspecified: Secondary | ICD-10-CM | POA: Diagnosis not present

## 2022-08-24 DIAGNOSIS — M7062 Trochanteric bursitis, left hip: Secondary | ICD-10-CM | POA: Diagnosis not present

## 2022-08-24 DIAGNOSIS — M25561 Pain in right knee: Secondary | ICD-10-CM | POA: Diagnosis not present

## 2022-08-24 DIAGNOSIS — M48061 Spinal stenosis, lumbar region without neurogenic claudication: Secondary | ICD-10-CM | POA: Diagnosis not present

## 2022-08-24 DIAGNOSIS — M7061 Trochanteric bursitis, right hip: Secondary | ICD-10-CM | POA: Diagnosis not present

## 2022-08-31 DIAGNOSIS — M4317 Spondylolisthesis, lumbosacral region: Secondary | ICD-10-CM | POA: Diagnosis not present

## 2022-08-31 DIAGNOSIS — M47817 Spondylosis without myelopathy or radiculopathy, lumbosacral region: Secondary | ICD-10-CM | POA: Diagnosis not present

## 2022-08-31 DIAGNOSIS — M4187 Other forms of scoliosis, lumbosacral region: Secondary | ICD-10-CM | POA: Diagnosis not present

## 2022-08-31 DIAGNOSIS — M4807 Spinal stenosis, lumbosacral region: Secondary | ICD-10-CM | POA: Diagnosis not present

## 2022-08-31 DIAGNOSIS — M545 Low back pain, unspecified: Secondary | ICD-10-CM | POA: Diagnosis not present

## 2022-08-31 DIAGNOSIS — M48061 Spinal stenosis, lumbar region without neurogenic claudication: Secondary | ICD-10-CM | POA: Diagnosis not present

## 2022-09-12 DIAGNOSIS — M48061 Spinal stenosis, lumbar region without neurogenic claudication: Secondary | ICD-10-CM | POA: Diagnosis not present

## 2022-10-03 DIAGNOSIS — M545 Low back pain, unspecified: Secondary | ICD-10-CM | POA: Diagnosis not present

## 2022-10-03 DIAGNOSIS — M48061 Spinal stenosis, lumbar region without neurogenic claudication: Secondary | ICD-10-CM | POA: Diagnosis not present

## 2022-10-03 DIAGNOSIS — M4186 Other forms of scoliosis, lumbar region: Secondary | ICD-10-CM | POA: Diagnosis not present

## 2022-10-03 DIAGNOSIS — G8929 Other chronic pain: Secondary | ICD-10-CM | POA: Diagnosis not present

## 2022-10-17 DIAGNOSIS — M47816 Spondylosis without myelopathy or radiculopathy, lumbar region: Secondary | ICD-10-CM | POA: Diagnosis not present

## 2022-10-24 DIAGNOSIS — M48061 Spinal stenosis, lumbar region without neurogenic claudication: Secondary | ICD-10-CM | POA: Diagnosis not present

## 2022-10-24 DIAGNOSIS — M7062 Trochanteric bursitis, left hip: Secondary | ICD-10-CM | POA: Diagnosis not present

## 2022-10-24 DIAGNOSIS — M7061 Trochanteric bursitis, right hip: Secondary | ICD-10-CM | POA: Diagnosis not present

## 2022-11-21 DIAGNOSIS — Z23 Encounter for immunization: Secondary | ICD-10-CM | POA: Diagnosis not present

## 2022-11-21 DIAGNOSIS — F419 Anxiety disorder, unspecified: Secondary | ICD-10-CM | POA: Diagnosis not present

## 2022-11-21 DIAGNOSIS — Z299 Encounter for prophylactic measures, unspecified: Secondary | ICD-10-CM | POA: Diagnosis not present

## 2022-11-21 DIAGNOSIS — I10 Essential (primary) hypertension: Secondary | ICD-10-CM | POA: Diagnosis not present

## 2022-11-21 DIAGNOSIS — I152 Hypertension secondary to endocrine disorders: Secondary | ICD-10-CM | POA: Diagnosis not present

## 2022-11-21 DIAGNOSIS — E1159 Type 2 diabetes mellitus with other circulatory complications: Secondary | ICD-10-CM | POA: Diagnosis not present

## 2022-11-28 DIAGNOSIS — Z6831 Body mass index (BMI) 31.0-31.9, adult: Secondary | ICD-10-CM | POA: Diagnosis not present

## 2022-11-28 DIAGNOSIS — Z7189 Other specified counseling: Secondary | ICD-10-CM | POA: Diagnosis not present

## 2022-11-28 DIAGNOSIS — Z79899 Other long term (current) drug therapy: Secondary | ICD-10-CM | POA: Diagnosis not present

## 2022-11-28 DIAGNOSIS — R5383 Other fatigue: Secondary | ICD-10-CM | POA: Diagnosis not present

## 2022-11-28 DIAGNOSIS — Z299 Encounter for prophylactic measures, unspecified: Secondary | ICD-10-CM | POA: Diagnosis not present

## 2022-11-28 DIAGNOSIS — Z1339 Encounter for screening examination for other mental health and behavioral disorders: Secondary | ICD-10-CM | POA: Diagnosis not present

## 2022-11-28 DIAGNOSIS — Z1331 Encounter for screening for depression: Secondary | ICD-10-CM | POA: Diagnosis not present

## 2022-11-28 DIAGNOSIS — Z0000 Encounter for general adult medical examination without abnormal findings: Secondary | ICD-10-CM | POA: Diagnosis not present

## 2022-11-28 DIAGNOSIS — E7800 Pure hypercholesterolemia, unspecified: Secondary | ICD-10-CM | POA: Diagnosis not present

## 2022-11-28 DIAGNOSIS — R413 Other amnesia: Secondary | ICD-10-CM | POA: Diagnosis not present

## 2022-11-28 DIAGNOSIS — I10 Essential (primary) hypertension: Secondary | ICD-10-CM | POA: Diagnosis not present

## 2022-12-14 DIAGNOSIS — F329 Major depressive disorder, single episode, unspecified: Secondary | ICD-10-CM | POA: Diagnosis not present

## 2022-12-14 DIAGNOSIS — F419 Anxiety disorder, unspecified: Secondary | ICD-10-CM | POA: Diagnosis not present

## 2022-12-26 DIAGNOSIS — Z1231 Encounter for screening mammogram for malignant neoplasm of breast: Secondary | ICD-10-CM | POA: Diagnosis not present

## 2023-03-15 DIAGNOSIS — F329 Major depressive disorder, single episode, unspecified: Secondary | ICD-10-CM | POA: Diagnosis not present

## 2023-03-15 DIAGNOSIS — F419 Anxiety disorder, unspecified: Secondary | ICD-10-CM | POA: Diagnosis not present

## 2023-05-10 DIAGNOSIS — F329 Major depressive disorder, single episode, unspecified: Secondary | ICD-10-CM | POA: Diagnosis not present

## 2023-05-10 DIAGNOSIS — F419 Anxiety disorder, unspecified: Secondary | ICD-10-CM | POA: Diagnosis not present

## 2023-07-05 DIAGNOSIS — I10 Essential (primary) hypertension: Secondary | ICD-10-CM | POA: Diagnosis not present

## 2023-07-05 DIAGNOSIS — F419 Anxiety disorder, unspecified: Secondary | ICD-10-CM | POA: Diagnosis not present

## 2023-07-05 DIAGNOSIS — Z299 Encounter for prophylactic measures, unspecified: Secondary | ICD-10-CM | POA: Diagnosis not present

## 2023-08-31 DIAGNOSIS — F329 Major depressive disorder, single episode, unspecified: Secondary | ICD-10-CM | POA: Diagnosis not present

## 2023-08-31 DIAGNOSIS — F419 Anxiety disorder, unspecified: Secondary | ICD-10-CM | POA: Diagnosis not present

## 2023-10-03 DIAGNOSIS — F419 Anxiety disorder, unspecified: Secondary | ICD-10-CM | POA: Diagnosis not present

## 2023-10-03 DIAGNOSIS — E1165 Type 2 diabetes mellitus with hyperglycemia: Secondary | ICD-10-CM | POA: Diagnosis not present

## 2023-10-03 DIAGNOSIS — Z299 Encounter for prophylactic measures, unspecified: Secondary | ICD-10-CM | POA: Diagnosis not present

## 2023-10-03 DIAGNOSIS — I10 Essential (primary) hypertension: Secondary | ICD-10-CM | POA: Diagnosis not present

## 2023-11-27 DIAGNOSIS — Z23 Encounter for immunization: Secondary | ICD-10-CM | POA: Diagnosis not present

## 2023-11-29 DIAGNOSIS — R5383 Other fatigue: Secondary | ICD-10-CM | POA: Diagnosis not present

## 2023-11-29 DIAGNOSIS — Z79899 Other long term (current) drug therapy: Secondary | ICD-10-CM | POA: Diagnosis not present

## 2023-11-29 DIAGNOSIS — E7800 Pure hypercholesterolemia, unspecified: Secondary | ICD-10-CM | POA: Diagnosis not present

## 2023-12-14 DIAGNOSIS — F329 Major depressive disorder, single episode, unspecified: Secondary | ICD-10-CM | POA: Diagnosis not present

## 2023-12-14 DIAGNOSIS — F419 Anxiety disorder, unspecified: Secondary | ICD-10-CM | POA: Diagnosis not present

## 2023-12-28 DIAGNOSIS — Z1231 Encounter for screening mammogram for malignant neoplasm of breast: Secondary | ICD-10-CM | POA: Diagnosis not present
